# Patient Record
Sex: Female | Born: 1935 | Race: White | Hispanic: No | Smoking: Never smoker
Health system: Southern US, Community
[De-identification: ages and names within clinical notes are randomized; demographics above are authoritative.]

## PROBLEM LIST (undated history)

## (undated) DIAGNOSIS — K259 Gastric ulcer, unspecified as acute or chronic, without hemorrhage or perforation: Secondary | ICD-10-CM

## (undated) DIAGNOSIS — K219 Gastro-esophageal reflux disease without esophagitis: Secondary | ICD-10-CM

## (undated) DIAGNOSIS — M1990 Unspecified osteoarthritis, unspecified site: Secondary | ICD-10-CM

## (undated) DIAGNOSIS — F419 Anxiety disorder, unspecified: Secondary | ICD-10-CM

## (undated) DIAGNOSIS — E785 Hyperlipidemia, unspecified: Secondary | ICD-10-CM

## (undated) DIAGNOSIS — I4891 Unspecified atrial fibrillation: Secondary | ICD-10-CM

## (undated) DIAGNOSIS — I10 Essential (primary) hypertension: Secondary | ICD-10-CM

## (undated) HISTORY — PX: CHOLECYSTECTOMY: SHX55

## (undated) HISTORY — PX: ABDOMINAL HYSTERECTOMY: SHX81

## (undated) HISTORY — PX: OTHER SURGICAL HISTORY: SHX169

---

## 2014-05-05 DIAGNOSIS — Z9181 History of falling: Secondary | ICD-10-CM | POA: Diagnosis not present

## 2014-05-05 DIAGNOSIS — Z79899 Other long term (current) drug therapy: Secondary | ICD-10-CM | POA: Diagnosis not present

## 2014-05-05 DIAGNOSIS — E782 Mixed hyperlipidemia: Secondary | ICD-10-CM | POA: Diagnosis not present

## 2014-05-05 DIAGNOSIS — Z0000 Encounter for general adult medical examination without abnormal findings: Secondary | ICD-10-CM | POA: Diagnosis not present

## 2014-12-18 DIAGNOSIS — E669 Obesity, unspecified: Secondary | ICD-10-CM | POA: Diagnosis not present

## 2014-12-18 DIAGNOSIS — M810 Age-related osteoporosis without current pathological fracture: Secondary | ICD-10-CM | POA: Diagnosis not present

## 2014-12-18 DIAGNOSIS — E782 Mixed hyperlipidemia: Secondary | ICD-10-CM | POA: Diagnosis not present

## 2014-12-18 DIAGNOSIS — I10 Essential (primary) hypertension: Secondary | ICD-10-CM | POA: Diagnosis not present

## 2015-12-13 DIAGNOSIS — N3091 Cystitis, unspecified with hematuria: Secondary | ICD-10-CM | POA: Diagnosis not present

## 2015-12-13 DIAGNOSIS — R300 Dysuria: Secondary | ICD-10-CM | POA: Diagnosis not present

## 2016-02-25 DIAGNOSIS — L570 Actinic keratosis: Secondary | ICD-10-CM | POA: Diagnosis not present

## 2016-02-25 DIAGNOSIS — L821 Other seborrheic keratosis: Secondary | ICD-10-CM | POA: Diagnosis not present

## 2016-02-25 DIAGNOSIS — C44319 Basal cell carcinoma of skin of other parts of face: Secondary | ICD-10-CM | POA: Diagnosis not present

## 2016-06-09 DIAGNOSIS — M353 Polymyalgia rheumatica: Secondary | ICD-10-CM | POA: Diagnosis not present

## 2016-06-09 DIAGNOSIS — Z0000 Encounter for general adult medical examination without abnormal findings: Secondary | ICD-10-CM | POA: Diagnosis not present

## 2016-06-09 DIAGNOSIS — R739 Hyperglycemia, unspecified: Secondary | ICD-10-CM | POA: Diagnosis not present

## 2016-06-09 DIAGNOSIS — Z79899 Other long term (current) drug therapy: Secondary | ICD-10-CM | POA: Diagnosis not present

## 2016-06-09 DIAGNOSIS — E785 Hyperlipidemia, unspecified: Secondary | ICD-10-CM | POA: Diagnosis not present

## 2016-06-09 DIAGNOSIS — I10 Essential (primary) hypertension: Secondary | ICD-10-CM | POA: Diagnosis not present

## 2016-06-22 DIAGNOSIS — M25549 Pain in joints of unspecified hand: Secondary | ICD-10-CM | POA: Diagnosis not present

## 2016-06-22 DIAGNOSIS — M6530 Trigger finger, unspecified finger: Secondary | ICD-10-CM | POA: Diagnosis not present

## 2016-08-14 ENCOUNTER — Other Ambulatory Visit: Payer: Self-pay

## 2016-08-14 NOTE — Patient Outreach (Signed)
[facility]) Care Management  08/14/2016  TIJANA WALDER 25-Sep-1931 [id number]  Medication Adherence call to Mrs. Monica French the reason for this call is because Mrs. Blew is showing past due under [place] on her pravastatin 20 mg  spoke to patient and said doctor [employer] told her  to take it every other day instead of one tablet a day Mrs. Wilmer said she does not want to receive any more calls from [place] care to take her off the list.    Progress Management Direct Dial [phone]  Fax [phone] [email or web address]

## 2017-03-11 DIAGNOSIS — J0100 Acute maxillary sinusitis, unspecified: Secondary | ICD-10-CM | POA: Diagnosis not present

## 2017-06-12 DIAGNOSIS — M353 Polymyalgia rheumatica: Secondary | ICD-10-CM | POA: Diagnosis not present

## 2017-06-12 DIAGNOSIS — E785 Hyperlipidemia, unspecified: Secondary | ICD-10-CM | POA: Diagnosis not present

## 2017-06-12 DIAGNOSIS — I10 Essential (primary) hypertension: Secondary | ICD-10-CM | POA: Diagnosis not present

## 2017-06-12 DIAGNOSIS — Z79899 Other long term (current) drug therapy: Secondary | ICD-10-CM | POA: Diagnosis not present

## 2017-06-12 DIAGNOSIS — Z0000 Encounter for general adult medical examination without abnormal findings: Secondary | ICD-10-CM | POA: Diagnosis not present

## 2017-11-27 DIAGNOSIS — Z23 Encounter for immunization: Secondary | ICD-10-CM | POA: Diagnosis not present

## 2018-06-13 DIAGNOSIS — Z0000 Encounter for general adult medical examination without abnormal findings: Secondary | ICD-10-CM | POA: Diagnosis not present

## 2018-06-13 DIAGNOSIS — R739 Hyperglycemia, unspecified: Secondary | ICD-10-CM | POA: Diagnosis not present

## 2018-06-13 DIAGNOSIS — Z79899 Other long term (current) drug therapy: Secondary | ICD-10-CM | POA: Diagnosis not present

## 2018-06-13 DIAGNOSIS — M8580 Other specified disorders of bone density and structure, unspecified site: Secondary | ICD-10-CM | POA: Diagnosis not present

## 2018-06-13 DIAGNOSIS — M353 Polymyalgia rheumatica: Secondary | ICD-10-CM | POA: Diagnosis not present

## 2018-06-13 DIAGNOSIS — E785 Hyperlipidemia, unspecified: Secondary | ICD-10-CM | POA: Diagnosis not present

## 2018-06-13 DIAGNOSIS — I10 Essential (primary) hypertension: Secondary | ICD-10-CM | POA: Diagnosis not present

## 2018-06-20 DIAGNOSIS — L209 Atopic dermatitis, unspecified: Secondary | ICD-10-CM | POA: Diagnosis not present

## 2019-06-06 DIAGNOSIS — Z0000 Encounter for general adult medical examination without abnormal findings: Secondary | ICD-10-CM | POA: Diagnosis not present

## 2019-06-06 DIAGNOSIS — E785 Hyperlipidemia, unspecified: Secondary | ICD-10-CM | POA: Diagnosis not present

## 2019-06-06 DIAGNOSIS — R739 Hyperglycemia, unspecified: Secondary | ICD-10-CM | POA: Diagnosis not present

## 2019-06-06 DIAGNOSIS — Z79899 Other long term (current) drug therapy: Secondary | ICD-10-CM | POA: Diagnosis not present

## 2019-06-06 DIAGNOSIS — T466X5A Adverse effect of antihyperlipidemic and antiarteriosclerotic drugs, initial encounter: Secondary | ICD-10-CM | POA: Diagnosis not present

## 2019-06-06 DIAGNOSIS — I10 Essential (primary) hypertension: Secondary | ICD-10-CM | POA: Diagnosis not present

## 2019-06-06 DIAGNOSIS — M791 Myalgia, unspecified site: Secondary | ICD-10-CM | POA: Diagnosis not present

## 2019-07-08 DIAGNOSIS — M7050 Other bursitis of knee, unspecified knee: Secondary | ICD-10-CM | POA: Diagnosis not present

## 2019-08-18 DIAGNOSIS — M25562 Pain in left knee: Secondary | ICD-10-CM | POA: Diagnosis not present

## 2019-08-18 DIAGNOSIS — M1712 Unilateral primary osteoarthritis, left knee: Secondary | ICD-10-CM | POA: Diagnosis not present

## 2020-06-11 DIAGNOSIS — Z79899 Other long term (current) drug therapy: Secondary | ICD-10-CM | POA: Diagnosis not present

## 2020-06-11 DIAGNOSIS — Z0000 Encounter for general adult medical examination without abnormal findings: Secondary | ICD-10-CM | POA: Diagnosis not present

## 2020-06-11 DIAGNOSIS — M353 Polymyalgia rheumatica: Secondary | ICD-10-CM | POA: Diagnosis not present

## 2020-06-11 DIAGNOSIS — I10 Essential (primary) hypertension: Secondary | ICD-10-CM | POA: Diagnosis not present

## 2020-06-11 DIAGNOSIS — G720 Drug-induced myopathy: Secondary | ICD-10-CM | POA: Diagnosis not present

## 2020-06-11 DIAGNOSIS — E785 Hyperlipidemia, unspecified: Secondary | ICD-10-CM | POA: Diagnosis not present

## 2020-06-11 DIAGNOSIS — M8580 Other specified disorders of bone density and structure, unspecified site: Secondary | ICD-10-CM | POA: Diagnosis not present

## 2020-06-11 DIAGNOSIS — D692 Other nonthrombocytopenic purpura: Secondary | ICD-10-CM | POA: Diagnosis not present

## 2020-06-11 DIAGNOSIS — M1991 Primary osteoarthritis, unspecified site: Secondary | ICD-10-CM | POA: Diagnosis not present

## 2020-06-11 DIAGNOSIS — R7301 Impaired fasting glucose: Secondary | ICD-10-CM | POA: Diagnosis not present

## 2020-07-13 DIAGNOSIS — M170 Bilateral primary osteoarthritis of knee: Secondary | ICD-10-CM | POA: Diagnosis not present

## 2020-07-13 DIAGNOSIS — G8929 Other chronic pain: Secondary | ICD-10-CM | POA: Insufficient documentation

## 2020-07-13 DIAGNOSIS — M1712 Unilateral primary osteoarthritis, left knee: Secondary | ICD-10-CM | POA: Diagnosis not present

## 2020-07-13 DIAGNOSIS — M25462 Effusion, left knee: Secondary | ICD-10-CM | POA: Diagnosis not present

## 2020-07-13 DIAGNOSIS — M25461 Effusion, right knee: Secondary | ICD-10-CM | POA: Diagnosis not present

## 2020-07-13 DIAGNOSIS — M25561 Pain in right knee: Secondary | ICD-10-CM | POA: Diagnosis not present

## 2020-07-13 DIAGNOSIS — M1711 Unilateral primary osteoarthritis, right knee: Secondary | ICD-10-CM | POA: Diagnosis not present

## 2020-07-13 DIAGNOSIS — M25562 Pain in left knee: Secondary | ICD-10-CM | POA: Diagnosis not present

## 2020-07-13 HISTORY — DX: Other chronic pain: G89.29

## 2020-08-23 ENCOUNTER — Other Ambulatory Visit: Payer: Self-pay | Admitting: Orthopedic Surgery

## 2020-09-07 ENCOUNTER — Encounter (HOSPITAL_COMMUNITY): Payer: Self-pay

## 2020-09-07 NOTE — Patient Instructions (Addendum)
DUE TO COVID-19 ONLY ONE VISITOR IS ALLOWED TO COME WITH YOU AND STAY IN THE WAITING ROOM ONLY DURING PRE OP AND PROCEDURE.   **NO VISITORS ARE ALLOWED IN THE SHORT STAY AREA OR RECOVERY ROOM!!**  IF YOU WILL BE ADMITTED INTO THE HOSPITAL YOU ARE ALLOWED ONLY TWO SUPPORT PEOPLE DURING VISITATION HOURS ONLY (10AM -8PM)   The support person(s) may change daily. The support person(s) must pass our screening, gel in and out, and wear a mask at all times, including in the patient's room. Patients must also wear a mask when staff or their support person are in the room.  No visitors under the age of 71. Any visitor under the age of 69 must be accompanied by an adult.   COVID SWAB TESTING MUST BE COMPLETED ON:  Thursday, 09-16-20 Between the hours of 8 and 3  **MUST PRESENT COMPLETED FORM AT TESTING SITE**    [place] (backside of the building)  You are not required to quarantine, however you are required to wear a well-fitted mask when you are out and around people not in your household.  Hand Hygiene often Do NOT share personal items Notify your provider if you are in close contact with someone who has COVID or you develop fever 100.4 or greater, new onset of sneezing, cough, sore throat, shortness of breath or body aches.         Your procedure is scheduled on:  Monday, 09-20-20   Report to [facility] Main  Entrance   Report to Short Stay at 5:15 AM   [facility])    Call this number if you have problems the morning of surgery [phone]   Do not eat food :After Midnight.   May have liquids until 5:00 AM day of surgery  CLEAR LIQUID DIET  Foods Allowed                                                                     Foods Excluded  Water, Black Coffee (no milk/no creamer) and tea, regular and decaf                              liquids that you cannot  Plain Jell-O in any flavor  (No red)                         see through such as: Fruit  ices (not with fruit pulp)                                 milk, soups, orange juice  Iced Popsicles (No red)                                    All solid food                             Apple juices Sports drinks like Gatorade (No red) Lightly seasoned clear broth or consume(fat free) Sugar  Complete one Ensure drink the morning of surgery at 5:00 AM  the day of surgery.        The day of surgery:  Drink ONE (1) Pre-Surgery Clear Ensure the morning of surgery. Drink in one sitting. Do not sip.  This drink was given to you during your hospital  pre-op appointment visit. Nothing else to drink after completing the Pre-Surgery Clear Ensure          If you have questions, please contact your surgeon's office.     Oral Hygiene is also important to reduce your risk of infection.                                    Remember - BRUSH YOUR TEETH THE MORNING OF SURGERY WITH YOUR REGULAR TOOTHPASTE   Do NOT smoke after Midnight   Take these medicines the morning of surgery with A SIP OF WATER:  Alprazolam, Amlodipine              You may not have any metal on your body including hair pins, jewelry, and body piercing             Do not wear make-up, lotions, powders, perfumes or deodorant  Do not wear nail polish including gel and S&S, artificial/acrylic nails, or any other type of covering on natural nails including finger and toenails. If you have artificial nails, gel coating, etc. that needs to be removed by a nail salon please have this removed prior to surgery or surgery may need to be canceled/ delayed if the surgeon/ anesthesia feels like they are unable to be safely monitored.   Do not shave  48 hours prior to surgery.         Do not bring valuables to the hospital. [place].   Contacts, dentures or bridgework may not be worn into surgery.   Bring small overnight bag day of surgery.   Special Instructions: Bring a copy of your healthcare  power of attorney and living will documents the day of surgery if you haven't scanned them in before.  Please read over the following fact sheets you were given: IF YOU HAVE QUESTIONS ABOUT YOUR PRE OP INSTRUCTIONS PLEASE CALL [place] - Preparing for Surgery Before surgery, you can play an important role.  Because skin is not sterile, your skin needs to be as free of germs as possible.  You can reduce the number of germs on your skin by washing with CHG (chlorahexidine gluconate) soap before surgery.  CHG is an antiseptic cleaner which kills germs and bonds with the skin to continue killing germs even after washing. Please DO NOT use if you have an allergy to CHG or antibacterial soaps.  If your skin becomes reddened/irritated stop using the CHG and inform your nurse when you arrive at Short Stay. Do not shave (including legs and underarms) for at least 48 hours prior to the first CHG shower.  You may shave your face/neck.  Please follow these instructions carefully:  1.  Shower with CHG Soap the night before surgery and the  morning of surgery.  2.  If you choose to wash your hair, wash your hair first as usual with your normal  shampoo.  3.  After you shampoo, rinse your hair and body thoroughly to remove the shampoo.  4.  Use CHG as you would any other liquid soap.  You can apply chg directly to the skin and wash.  Gently with a scrungie or clean washcloth.  5.  Apply the CHG Soap to your body ONLY FROM THE NECK DOWN.   Do   not use on face/ open                           Wound or open sores. Avoid contact with eyes, ears mouth and   genitals (private parts).                       Wash face,  Genitals (private parts) with your normal soap.             6.  Wash thoroughly, paying special attention to the area where your    surgery  will be performed.  7.  Thoroughly rinse your body with warm water from the neck down.  8.  DO NOT shower/wash with  your normal soap after using and rinsing off the CHG Soap.                9.  Pat yourself dry with a clean towel.            10.  Wear clean pajamas.            11.  Place clean sheets on your bed the night of your first shower and do not  sleep with pets. Day of Surgery : Do not apply any lotions/deodorants the morning of surgery.  Please wear clean clothes to the hospital/surgery center.  FAILURE TO FOLLOW THESE INSTRUCTIONS MAY RESULT IN THE CANCELLATION OF YOUR SURGERY  PATIENT SIGNATURE_________________________________  NURSE SIGNATURE__________________________________  ________________________________________________________________________   Monica French  An incentive spirometer is a tool that can help keep your lungs clear and active. This tool measures how well you are filling your lungs with each breath. Taking long deep breaths may help reverse or decrease the chance of developing breathing (pulmonary) problems (especially infection) following: A long period of time when you are unable to move or be active. BEFORE THE PROCEDURE  If the spirometer includes an indicator to show your best effort, your nurse or respiratory therapist will set it to a desired goal. If possible, sit up straight or lean slightly forward. Try not to slouch. Hold the incentive spirometer in an upright position. INSTRUCTIONS FOR USE  Sit on the edge of your bed if possible, or sit up as far as you can in bed or on a chair. Hold the incentive spirometer in an upright position. Breathe out normally. Place the mouthpiece in your mouth and seal your lips tightly around it. Breathe in slowly and as deeply as possible, raising the piston or the ball toward the top of the column. Hold your breath for 3-5 seconds or for as long as possible. Allow the piston or ball to fall to the bottom of the column. Remove the mouthpiece from your mouth and breathe out normally. Rest for a few seconds and repeat  Steps 1 through 7 at least 10 times every 1-2 hours when you are awake. Take your time and take a few normal breaths between deep breaths. The spirometer may include an indicator to show your best effort. Use the indicator as a goal to work toward during each repetition. After each set of 10 deep breaths, practice coughing to be sure  your lungs are clear. If you have an incision (the cut made at the time of surgery), support your incision when coughing by placing a pillow or rolled up towels firmly against it. Once you are able to get out of bed, walk around indoors and cough well. You may stop using the incentive spirometer when instructed by your caregiver.  RISKS AND COMPLICATIONS Take your time so you do not get dizzy or light-headed. If you are in pain, you may need to take or ask for pain medication before doing incentive spirometry. It is harder to take a deep breath if you are having pain. AFTER USE Rest and breathe slowly and easily. It can be helpful to keep track of a log of your progress. Your caregiver can provide you with a simple table to help with this. If you are using the spirometer at home, follow these instructions: [place] IF:  You are having difficultly using the spirometer. You have trouble using the spirometer as often as instructed. Your pain medication is not giving enough relief while using the spirometer. You develop fever of 100.5 F (38.1 C) or higher. SEEK IMMEDIATE MEDICAL CARE IF:  You cough up bloody sputum that had not been present before. You develop fever of 102 F (38.9 C) or greater. You develop worsening pain at or near the incision site. MAKE SURE YOU:  Understand these instructions. Will watch your condition. Will get help right away if you are not doing well or get worse. Document Released: 05/01/2006 Document Revised: 03/13/2011 Document Reviewed: 07/02/2006 [facility] Patient Information 2014 [place],  [place].   ________________________________________________________________________

## 2020-09-07 NOTE — Progress Notes (Addendum)
COVID swab appointment: 09-16-20  COVID Vaccine Completed:  Yes x3 Date COVID Vaccine completed: Has received booster:  yes x1 COVID vaccine manufacturer:     Moderna     Date of COVID positive in last 90 days:  No  PCP - Williams Che, MD Cardiologist - N/A  Chest x-ray - N/A EKG - 09-09-20 Epic Stress Test - N/A ECHO - N/a Cardiac Cath - N/A Pacemaker/ICD device last checked: Spinal Cord Stimulator:  Sleep Study - N/A CPAP -   Fasting Blood Sugar - N/A Checks Blood Sugar _____ times a day  Blood Thinner Instructions: N/A Aspirin Instructions: Last Dose:  Activity level:  Can go up a flight of stairs and perform activities of daily living without stopping and without symptoms of chest pain or shortness of breath. Able to exercise without symptoms   Anesthesia review:  Afib noted on EKG today.  Patient seen by cardiology today  Patient denies shortness of breath, fever, cough and chest pain at PAT appointment   Patient verbalized understanding of instructions that were given to them at the PAT appointment. Patient was also instructed that they will need to review over the PAT instructions again at home before surgery.

## 2020-09-09 ENCOUNTER — Encounter (HOSPITAL_COMMUNITY)
Admission: RE | Admit: 2020-09-09 | Discharge: 2020-09-09 | Disposition: A | Discharge status: Home / Self Care | Payer: Medicare Other | Source: Ambulatory Visit | Attending: Orthopedic Surgery | Admitting: Orthopedic Surgery

## 2020-09-09 ENCOUNTER — Encounter (HOSPITAL_COMMUNITY): Payer: Self-pay | Admitting: Physician Assistant

## 2020-09-09 ENCOUNTER — Encounter (HOSPITAL_COMMUNITY): Payer: Self-pay

## 2020-09-09 ENCOUNTER — Ambulatory Visit (HOSPITAL_COMMUNITY)
Admission: RE | Admit: 2020-09-09 | Discharge: 2020-09-09 | Disposition: A | Discharge status: Home / Self Care | Payer: Medicare Other | Source: Ambulatory Visit | Attending: Physician Assistant | Admitting: Physician Assistant

## 2020-09-09 ENCOUNTER — Other Ambulatory Visit: Payer: Self-pay

## 2020-09-09 VITALS — BP 138/84 | HR 142 | Ht 63.0 in | Wt 150.2 lb

## 2020-09-09 DIAGNOSIS — Z01818 Encounter for other preprocedural examination: Secondary | ICD-10-CM | POA: Diagnosis not present

## 2020-09-09 DIAGNOSIS — I480 Paroxysmal atrial fibrillation: Secondary | ICD-10-CM | POA: Insufficient documentation

## 2020-09-09 DIAGNOSIS — E785 Hyperlipidemia, unspecified: Secondary | ICD-10-CM | POA: Diagnosis not present

## 2020-09-09 DIAGNOSIS — I10 Essential (primary) hypertension: Secondary | ICD-10-CM | POA: Insufficient documentation

## 2020-09-09 DIAGNOSIS — I4891 Unspecified atrial fibrillation: Secondary | ICD-10-CM | POA: Insufficient documentation

## 2020-09-09 DIAGNOSIS — Z79899 Other long term (current) drug therapy: Secondary | ICD-10-CM | POA: Insufficient documentation

## 2020-09-09 DIAGNOSIS — Z7901 Long term (current) use of anticoagulants: Secondary | ICD-10-CM | POA: Insufficient documentation

## 2020-09-09 DIAGNOSIS — D6869 Other thrombophilia: Secondary | ICD-10-CM | POA: Insufficient documentation

## 2020-09-09 HISTORY — DX: Anxiety disorder, unspecified: F41.9

## 2020-09-09 HISTORY — DX: Other thrombophilia: D68.69

## 2020-09-09 HISTORY — DX: Unspecified osteoarthritis, unspecified site: M19.90

## 2020-09-09 HISTORY — DX: Paroxysmal atrial fibrillation: I48.0

## 2020-09-09 HISTORY — DX: Hyperlipidemia, unspecified: E78.5

## 2020-09-09 HISTORY — DX: Essential (primary) hypertension: I10

## 2020-09-09 HISTORY — DX: Gastric ulcer, unspecified as acute or chronic, without hemorrhage or perforation: K25.9

## 2020-09-09 LAB — CBC WITH DIFFERENTIAL/PLATELET
Abs Immature Granulocytes: 0.02 10*3/uL (ref 0.00–0.07)
Basophils Absolute: 0 10*3/uL (ref 0.0–0.1)
Basophils Relative: 1 %
Eosinophils Absolute: 0.1 10*3/uL (ref 0.0–0.5)
Eosinophils Relative: 1 %
HCT: 43.5 % (ref 36.0–46.0)
Hemoglobin: 13.7 g/dL (ref 12.0–15.0)
Immature Granulocytes: 0 %
Lymphocytes Relative: 35 %
Lymphs Abs: 2.1 10*3/uL (ref 0.7–4.0)
MCH: 28.9 pg (ref 26.0–34.0)
MCHC: 31.5 g/dL (ref 30.0–36.0)
MCV: 91.8 fL (ref 80.0–100.0)
Monocytes Absolute: 0.4 10*3/uL (ref 0.1–1.0)
Monocytes Relative: 7 %
Neutro Abs: 3.4 10*3/uL (ref 1.7–7.7)
Neutrophils Relative %: 56 %
Platelets: 204 10*3/uL (ref 150–400)
RBC: 4.74 MIL/uL (ref 3.87–5.11)
RDW: 14.4 % (ref 11.5–15.5)
WBC: 6 10*3/uL (ref 4.0–10.5)
nRBC: 0 % (ref 0.0–0.2)

## 2020-09-09 LAB — COMPREHENSIVE METABOLIC PANEL
ALT: 13 U/L (ref 0–44)
AST: 16 U/L (ref 15–41)
Albumin: 4.4 g/dL (ref 3.5–5.0)
Alkaline Phosphatase: 59 U/L (ref 38–126)
Anion gap: 10 (ref 5–15)
BUN: 22 mg/dL (ref 8–23)
CO2: 26 mmol/L (ref 22–32)
Calcium: 10 mg/dL (ref 8.9–10.3)
Chloride: 104 mmol/L (ref 98–111)
Creatinine, Ser: 0.78 mg/dL (ref 0.44–1.00)
GFR, Estimated: >60 mL/min (ref >60)
Glucose, Bld: 118 mg/dL — ABNORMAL HIGH (ref 70–99)
Potassium: 4.3 mmol/L (ref 3.5–5.1)
Sodium: 140 mmol/L (ref 135–145)
Total Bilirubin: 0.8 mg/dL (ref 0.3–1.2)
Total Protein: 7.2 g/dL (ref 6.5–8.1)

## 2020-09-09 LAB — SURGICAL PCR SCREEN
MRSA, PCR: NEGATIVE
Staphylococcus aureus: NEGATIVE

## 2020-09-09 MED ORDER — METOPROLOL TARTRATE 25 MG PO TABS: 25.0000 mg | ORAL_TABLET | Freq: Two times a day (BID) | ORAL | 3 refills | Status: DC

## 2020-09-09 MED ORDER — APIXABAN 5 MG PO TABS: 5.0000 mg | ORAL_TABLET | Freq: Two times a day (BID) | ORAL | 3 refills | Status: DC

## 2020-09-09 NOTE — Progress Notes (Signed)
Primary Care Physician: Street, Sharon Mt, MD Primary Cardiologist: none Primary Electrophysiologist: none Referring Physician: Elvina Sidle pre admit testing   Monica French is a 87 y.o. female with a history of HLD, HTN, atrial fibrillation who presents for consultation in the [facility]. The patient was initially diagnosed with atrial fibrillation 09/09/20 after presenting for knee surgery. She was incidentally noted to have tachycardia and ECG showed afib with RVR. Patient is asymptomatic. There were no specific triggers that she could identify. Patient has a CHADS2VASC score of 4.   Today, she denies symptoms of palpitations, chest pain, shortness of breath, orthopnea, PND, lower extremity edema, dizziness, presyncope, syncope, snoring, daytime somnolence, bleeding, or neurologic sequela. The patient is tolerating medications without difficulties and is otherwise without complaint today.    Atrial Fibrillation Risk Factors:  she does not have symptoms or diagnosis of sleep apnea. she does not have a history of rheumatic fever. she does not have a history of alcohol use. The patient does not have a history of early familial atrial fibrillation or other arrhythmias.  she has a BMI of Body mass index is 26.61 kg/m.Marland Kitchen Filed Weights   09/09/20 0938  Weight: 68.1 kg    No family history on file.   Atrial Fibrillation Management history:  Previous antiarrhythmic drugs: none Previous cardioversions: none Previous ablations: none CHADS2VASC score: 4 Anticoagulation history: none   Past Medical History:  Diagnosis Date   Anxiety    Arthritis    Gastric ulcer    Hyperlipidemia    Hypertension    Past Surgical History:  Procedure Laterality Date   ABDOMINAL HYSTERECTOMY     CHOLECYSTECTOMY     GYN surgery      Current Outpatient Medications  Medication Sig Dispense Refill   ALPRAZolam (XANAX) 0.25 MG tablet Take 0.125-0.25 mg by  mouth 2 (two) times daily as needed for anxiety.     amLODipine (NORVASC) 10 MG tablet Take 10 mg by mouth every morning.     apixaban (ELIQUIS) 5 MG TABS tablet Take 1 tablet (5 mg total) by mouth 2 (two) times daily. 60 tablet 3   Ascorbic Acid (VITAMIN C) 1000 MG tablet Take 1,000 mg by mouth daily.     benazepril (LOTENSIN) 40 MG tablet Take 40 mg by mouth every morning.     cholecalciferol (VITAMIN D3) 25 MCG (1000 UNIT) tablet Take 1,000 Units by mouth daily.     metoprolol tartrate (LOPRESSOR) 25 MG tablet Take 1 tablet (25 mg total) by mouth 2 (two) times daily. 60 tablet 3   Multiple Vitamins-Minerals (CENTRUM SILVER 50+WOMEN) TABS Take by mouth.     Turmeric 500 MG TABS Take 500 mg by mouth daily.     No current facility-administered medications for this encounter.    Allergies  Allergen Reactions   Codeine Nausea And Vomiting    Social History   Socioeconomic History   Marital status: Widowed    Spouse name: Not on file   Number of children: Not on file   Years of education: Not on file   Highest education level: Not on file  Occupational History   Not on file  Tobacco Use   Smoking status: Never   Smokeless tobacco: Never  Vaping Use   Vaping Use: Never used  Substance and Sexual Activity   Alcohol use: Never   Drug use: Never   Sexual activity: Not on file  Other Topics Concern   Not on file  Social History Narrative   Not on file   Social Determinants of Health   Financial Resource Strain: Not on file  Food Insecurity: Not on file  Transportation Needs: Not on file  Physical Activity: Not on file  Stress: Not on file  Social Connections: Not on file  Intimate Partner Violence: Not on file     ROS- All systems are reviewed and negative except as per the HPI above.  Physical Exam: Vitals:   09/09/20 0938  BP: 138/84  Pulse: (!) 142  Weight: 68.1 kg  Height: '5\' 3"'$  (1.6 m)    GEN- The patient is a well appearing elderly female, alert and  oriented x 3 today.   Head- normocephalic, atraumatic Eyes-  Sclera clear, conjunctiva pink Ears- hearing intact Oropharynx- clear Neck- supple  Lungs- Clear to ausculation bilaterally, normal work of breathing Heart- irregular rate and rhythm, tachycardia, no murmurs, rubs or gallops  GI- soft, NT, ND, + BS Extremities- no clubbing, cyanosis, or edema MS- no significant deformity or atrophy Skin- no rash or lesion Psych- euthymic mood, full affect Neuro- strength and sensation are intact  Wt Readings from Last 3 Encounters:  09/09/20 68.1 kg  09/09/20 67.6 kg    EKG today demonstrates  Afib RVR Vent. rate 142 BPM PR interval * ms QRS duration 84 ms QT/QTcB 316/486 ms   Epic records are reviewed at length today  CHA2DS2-VASc Score = 4  The patient's score is based upon: CHF History: 0 HTN History: 1 Diabetes History: 0 Stroke History: 0 Vascular Disease History: 0 Age Score: 2 Gender Score: 1      ASSESSMENT AND PLAN: 1. New onset atrial fibrillation  The patient's CHA2DS2-VASc score is 4, indicating a 4.8% annual risk of stroke.   Patient in rapid asymptomatic afib. General education about afib provided and questions answered. We also discussed her stroke risk and the risks and benefits of anticoagulation. Will start Eliquis 5 mg BID (weight >60 kg, Cr < 1.5) Start Lopressor 25 mg BID  Would check echo once she is better rate controlled.  Could consider DCCV after 3 weeks of anticoagulation. Or, given her advanced age and paucity of symptoms, could consider a rate control strategy.   2. Secondary Hypercoagulable State (ICD10:  D68.69) The patient is at significant risk for stroke/thromboembolism based upon her CHA2DS2-VASc Score of 4.  Start Apixaban (Eliquis).   3. HTN Stable, med changes as above.   Follow up in the AF clinic next week to reevaluate rate control. Patient would like long-term follow up in the [place] office, will refer.    [facility] [address] [place], [place] [address] [phone] 09/09/2020 10:20 AM

## 2020-09-09 NOTE — Patient Instructions (Signed)
Start Eliquis 5 mg twice a day  Start metoprolol 25 mg twice a day   

## 2020-09-10 NOTE — Progress Notes (Signed)
Anesthesia Chart Review   Case: [id number] Date/Time: 09/20/20 0745   Procedure: TOTAL KNEE ARTHROPLASTY (Right: Knee)   Anesthesia type: Spinal   Pre-op diagnosis: Osteoarthritis of right knee M17.11   Location: [facility] ROOM 06 / [facility]   Surgeons: Vickey Huger, MD       DISCUSSION:87 y.o. never smoker with h/o HTN, right knee OA scheduled for above procedure 09/20/2020 with Dr. Vickey Huger.   Pt found to be in a-fib with RVR, rate of 111bpm.  No history of a-fib.  I called the a-fib clinic who can see here today. Pt stable for discharge with f/u this morning with a-fib clinic.   Addendum 09/10/2020:  Pt started on Eliquis at this visit and Lopressor. She has a follow up visit next week for recheck on 09/13/2020.  Echo to be ordered once she is rate controlled. Discussed with Dr. Ruel Favors office. Will continue to follow.  VS: BP (!) 152/89   Pulse 66   Temp 36.8 C   Resp 18   Ht '5\' 3"'$  (1.6 m)   Wt 67.6 kg   SpO2 100%   BMI 26.39 kg/m   PROVIDERS: Street, Sharon Mt, MD is PCP   Shirlee More, MD is Cardiologist  LABS: Labs reviewed: Acceptable for surgery. (all labs ordered are listed, but only abnormal results are displayed)  Labs Reviewed  COMPREHENSIVE METABOLIC PANEL - Abnormal; Notable for the following components:      Result Value   Glucose, Bld 118 (*)    All other components within normal limits  SURGICAL PCR SCREEN  CBC WITH DIFFERENTIAL/PLATELET     IMAGES:   EKG: 09/09/2020 Rate 111 bpm Atrial fibrillation with rapid ventricular response Low voltage QRS Cannot rule out Anterior infarct , age undetermined IWMI No old tracing to compare  CV:  Past Medical History:  Diagnosis Date   Anxiety    Arthritis    Gastric ulcer    Hyperlipidemia    Hypertension     Past Surgical History:  Procedure Laterality Date   ABDOMINAL HYSTERECTOMY     CHOLECYSTECTOMY     GYN surgery      MEDICATIONS:  ALPRAZolam (XANAX) 0.25 MG tablet   amLODipine (NORVASC)  10 MG tablet   apixaban (ELIQUIS) 5 MG TABS tablet   Ascorbic Acid (VITAMIN C) 1000 MG tablet   benazepril (LOTENSIN) 40 MG tablet   cholecalciferol (VITAMIN D3) 25 MCG (1000 UNIT) tablet   metoprolol tartrate (LOPRESSOR) 25 MG tablet   Multiple Vitamins-Minerals (CENTRUM SILVER 50+WOMEN) TABS   Turmeric 500 MG TABS   No current facility-administered medications for this encounter.     Konrad Felix Ward, PA-C [facility]-Surgical Testing [phone]

## 2020-09-13 ENCOUNTER — Ambulatory Visit (HOSPITAL_COMMUNITY)
Admission: RE | Admit: 2020-09-13 | Discharge: 2020-09-13 | Disposition: A | Discharge status: Home / Self Care | Payer: Medicare Other | Source: Ambulatory Visit | Attending: Physician Assistant | Admitting: Physician Assistant

## 2020-09-13 ENCOUNTER — Encounter (HOSPITAL_COMMUNITY): Payer: Self-pay | Admitting: Physician Assistant

## 2020-09-13 ENCOUNTER — Other Ambulatory Visit: Payer: Self-pay

## 2020-09-13 VITALS — BP 142/82 | HR 106 | Ht 63.0 in | Wt 150.8 lb

## 2020-09-13 DIAGNOSIS — D6869 Other thrombophilia: Secondary | ICD-10-CM | POA: Diagnosis not present

## 2020-09-13 DIAGNOSIS — Z7901 Long term (current) use of anticoagulants: Secondary | ICD-10-CM | POA: Diagnosis not present

## 2020-09-13 DIAGNOSIS — I4819 Other persistent atrial fibrillation: Secondary | ICD-10-CM

## 2020-09-13 DIAGNOSIS — E785 Hyperlipidemia, unspecified: Secondary | ICD-10-CM | POA: Insufficient documentation

## 2020-09-13 DIAGNOSIS — I10 Essential (primary) hypertension: Secondary | ICD-10-CM | POA: Insufficient documentation

## 2020-09-13 DIAGNOSIS — Z79899 Other long term (current) drug therapy: Secondary | ICD-10-CM | POA: Diagnosis not present

## 2020-09-13 DIAGNOSIS — I4821 Permanent atrial fibrillation: Secondary | ICD-10-CM | POA: Insufficient documentation

## 2020-09-13 HISTORY — DX: Other persistent atrial fibrillation: I48.19

## 2020-09-13 MED ORDER — METOPROLOL TARTRATE 50 MG PO TABS: 50.0000 mg | ORAL_TABLET | Freq: Two times a day (BID) | ORAL | 3 refills | Status: DC

## 2020-09-13 NOTE — Patient Instructions (Signed)
Decrease amlodipine to '5mg'$  once a day (1/2 of your '10mg'$  tablet)  Increase metoprolol to '50mg'$  twice a day (2 of your '25mg'$  tablets twice a day)

## 2020-09-13 NOTE — Progress Notes (Addendum)
Primary Care Physician: Street, Sharon Mt, MD Primary Cardiologist: Dr Bettina Gavia (new) Primary Electrophysiologist: none Referring Physician: Elvina Sidle pre admit testing   KEIERA WEISLER is a 87 y.o. female with a history of HLD, HTN, atrial fibrillation who presents for consultation in the [facility]. The patient was initially diagnosed with atrial fibrillation 09/09/20 after presenting for knee surgery. She was incidentally noted to have tachycardia and ECG showed afib with RVR. Patient is asymptomatic. There were no specific triggers that she could identify. Patient has a CHADS2VASC score of 4.   On follow up today, patient remains unaware of her arrhythmia. Her heart rate is better controlled on the BB. She denies any bleeding issues on anticoagulation.   Today, she denies symptoms of palpitations, chest pain, shortness of breath, orthopnea, PND, lower extremity edema, dizziness, presyncope, syncope, snoring, daytime somnolence, bleeding, or neurologic sequela. The patient is tolerating medications without difficulties and is otherwise without complaint today.    Atrial Fibrillation Risk Factors:  she does not have symptoms or diagnosis of sleep apnea. she does not have a history of rheumatic fever. she does not have a history of alcohol use. The patient does not have a history of early familial atrial fibrillation or other arrhythmias.  she has a BMI of Body mass index is 26.71 kg/m.Marland Kitchen Filed Weights   09/13/20 1403  Weight: 68.4 kg    No family history on file.   Atrial Fibrillation Management history:  Previous antiarrhythmic drugs: none Previous cardioversions: none Previous ablations: none CHADS2VASC score: 4 Anticoagulation history: none   Past Medical History:  Diagnosis Date   Anxiety    Arthritis    Gastric ulcer    Hyperlipidemia    Hypertension    Past Surgical History:  Procedure Laterality Date   ABDOMINAL  HYSTERECTOMY     CHOLECYSTECTOMY     GYN surgery      Current Outpatient Medications  Medication Sig Dispense Refill   ALPRAZolam (XANAX) 0.25 MG tablet Take 0.125-0.25 mg by mouth 2 (two) times daily as needed for anxiety.     amLODipine (NORVASC) 10 MG tablet Take 10 mg by mouth every morning.     apixaban (ELIQUIS) 5 MG TABS tablet Take 1 tablet (5 mg total) by mouth 2 (two) times daily. 60 tablet 3   Ascorbic Acid (VITAMIN C) 1000 MG tablet Take 1,000 mg by mouth daily.     benazepril (LOTENSIN) 40 MG tablet Take 40 mg by mouth every morning.     cholecalciferol (VITAMIN D3) 25 MCG (1000 UNIT) tablet Take 1,000 Units by mouth daily.     metoprolol tartrate (LOPRESSOR) 25 MG tablet Take 1 tablet (25 mg total) by mouth 2 (two) times daily. 60 tablet 3   Multiple Vitamins-Minerals (CENTRUM SILVER 50+WOMEN) TABS Take by mouth.     Turmeric 500 MG TABS Take 500 mg by mouth daily.     No current facility-administered medications for this encounter.    Allergies  Allergen Reactions   Codeine Nausea And Vomiting    Social History   Socioeconomic History   Marital status: Widowed    Spouse name: Not on file   Number of children: Not on file   Years of education: Not on file   Highest education level: Not on file  Occupational History   Not on file  Tobacco Use   Smoking status: Never   Smokeless tobacco: Never  Vaping Use   Vaping Use: Never used  Substance and Sexual Activity   Alcohol use: Never   Drug use: Never   Sexual activity: Not on file  Other Topics Concern   Not on file  Social History Narrative   Not on file   Social Determinants of Health   Financial Resource Strain: Not on file  Food Insecurity: Not on file  Transportation Needs: Not on file  Physical Activity: Not on file  Stress: Not on file  Social Connections: Not on file  Intimate Partner Violence: Not on file     ROS- All systems are reviewed and negative except as per the HPI  above.  Physical Exam: Vitals:   09/13/20 1403  BP: (!) 142/82  Pulse: (!) 106  Weight: 68.4 kg  Height: '5\' 3"'$  (1.6 m)    GEN- The patient is a well appearing elderly female, alert and oriented x 3 today.   HEENT-head normocephalic, atraumatic, sclera clear, conjunctiva pink, hearing intact, trachea midline. Lungs- Clear to ausculation bilaterally, normal work of breathing Heart- irregular rate and rhythm, no murmurs, rubs or gallops  GI- soft, NT, ND, + BS Extremities- no clubbing, cyanosis, or edema MS- no significant deformity or atrophy Skin- no rash or lesion Psych- euthymic mood, full affect Neuro- strength and sensation are intact   Wt Readings from Last 3 Encounters:  09/13/20 68.4 kg  09/09/20 68.1 kg  09/09/20 67.6 kg    EKG today demonstrates  Afib  Vent. rate 106 BPM PR interval * ms QRS duration 88 ms QT/QTcB 330/438 ms   Epic records are reviewed at length today  CHA2DS2-VASc Score = 4  The patient's score is based upon: CHF History: 0 HTN History: 1 Diabetes History: 0 Stroke History: 0 Vascular Disease History: 0 Age Score: 2 Gender Score: 1      ASSESSMENT AND PLAN: 1. Persistent atrial fibrillation  The patient's CHA2DS2-VASc score is 4, indicating a 4.8% annual risk of stroke.   Patient remains in asymptomatic afib, borderline heart rates.  Continue Eliquis 5 mg BID (weight >60 kg, Cr < 1.5) Increase Lopressor to 50 mg BID  Check echocardiogram  Could consider DCCV after 3 weeks of anticoagulation. Or, given her advanced age and paucity of symptoms, could consider a rate control strategy.  Given her new onset afib, would recommend postponing knee surgery for now until her cardiac workup can be completed.   2. Secondary Hypercoagulable State (ICD10:  D68.69) The patient is at significant risk for stroke/thromboembolism based upon her CHA2DS2-VASc Score of 4.  Continue Apixaban (Eliquis).   3. HTN Stable, decrease amlodipine to  accommodate more metoprolol for rate control.    Follow up with Dr Bettina Gavia as scheduled.    [facility] [address] [place], [place] [address] [phone] 09/13/2020 2:15 PM

## 2020-09-15 DIAGNOSIS — I119 Hypertensive heart disease without heart failure: Secondary | ICD-10-CM

## 2020-09-15 DIAGNOSIS — M1990 Unspecified osteoarthritis, unspecified site: Secondary | ICD-10-CM | POA: Insufficient documentation

## 2020-09-15 DIAGNOSIS — K259 Gastric ulcer, unspecified as acute or chronic, without hemorrhage or perforation: Secondary | ICD-10-CM | POA: Insufficient documentation

## 2020-09-15 DIAGNOSIS — E785 Hyperlipidemia, unspecified: Secondary | ICD-10-CM | POA: Insufficient documentation

## 2020-09-15 DIAGNOSIS — I10 Essential (primary) hypertension: Secondary | ICD-10-CM | POA: Insufficient documentation

## 2020-09-15 DIAGNOSIS — F419 Anxiety disorder, unspecified: Secondary | ICD-10-CM | POA: Insufficient documentation

## 2020-09-15 HISTORY — DX: Hypertensive heart disease without heart failure: I11.9

## 2020-09-20 ENCOUNTER — Ambulatory Visit (HOSPITAL_COMMUNITY): Admission: RE | Admit: 2020-09-20 | Payer: Medicare Other | Source: Home / Self Care | Admitting: Orthopedic Surgery

## 2020-09-20 ENCOUNTER — Encounter (HOSPITAL_COMMUNITY): Admission: RE | Payer: Self-pay | Source: Home / Self Care

## 2020-09-20 SURGERY — ARTHROPLASTY, KNEE, TOTAL
Anesthesia: Spinal | Site: Knee | Laterality: Right

## 2020-09-24 ENCOUNTER — Ambulatory Visit (INDEPENDENT_AMBULATORY_CARE_PROVIDER_SITE_OTHER): Payer: Medicare Other

## 2020-09-24 ENCOUNTER — Other Ambulatory Visit: Payer: Self-pay

## 2020-09-24 DIAGNOSIS — I4819 Other persistent atrial fibrillation: Secondary | ICD-10-CM

## 2020-09-24 LAB — ECHOCARDIOGRAM COMPLETE
Area-P 1/2: 5.09 cm2
P 1/2 time: 450 msec
S' Lateral: 2.8 cm

## 2020-09-24 NOTE — Progress Notes (Signed)
Complete echocardiogram performed. Definity needed but not used due to poor IV access.  Jimmy Bascom Biel RDCS, RVT

## 2020-10-01 ENCOUNTER — Ambulatory Visit (INDEPENDENT_AMBULATORY_CARE_PROVIDER_SITE_OTHER): Payer: Medicare Other

## 2020-10-01 ENCOUNTER — Ambulatory Visit: Payer: Medicare Other | Admitting: Cardiology

## 2020-10-01 ENCOUNTER — Other Ambulatory Visit: Payer: Self-pay

## 2020-10-01 ENCOUNTER — Encounter: Payer: Self-pay | Admitting: Cardiology

## 2020-10-01 VITALS — BP 140/70 | HR 74 | Ht 63.0 in | Wt 150.0 lb

## 2020-10-01 DIAGNOSIS — I4819 Other persistent atrial fibrillation: Secondary | ICD-10-CM

## 2020-10-01 DIAGNOSIS — K254 Chronic or unspecified gastric ulcer with hemorrhage: Secondary | ICD-10-CM

## 2020-10-01 DIAGNOSIS — I10 Essential (primary) hypertension: Secondary | ICD-10-CM

## 2020-10-01 DIAGNOSIS — D6869 Other thrombophilia: Secondary | ICD-10-CM

## 2020-10-01 MED ORDER — OMEPRAZOLE 20 MG PO CPDR: 20.0000 mg | DELAYED_RELEASE_CAPSULE | Freq: Every day | ORAL | 11 refills | Status: DC

## 2020-10-01 MED ORDER — DILTIAZEM HCL 30 MG PO TABS: 30.0000 mg | ORAL_TABLET | Freq: Three times a day (TID) | ORAL | 3 refills | Status: DC

## 2020-10-01 NOTE — Addendum Note (Signed)
Addended by: Resa Miner I on: 10/01/2020 02:39 PM   Modules accepted: Orders

## 2020-10-01 NOTE — Progress Notes (Signed)
Cardiology Office Note:    Date:  10/01/2020   ID:  Monica French, DOB 14-Aug-1931, MRN [id number]  PCP:  Street, Sharon Mt, MD  Cardiologist:  Shirlee More, MD    Referring MD: Oliver Barre, PA    ASSESSMENT:    1. Persistent atrial fibrillation ([place])   2. Secondary hypercoagulable state ([place])   3. Primary hypertension   4. Chronic gastric ulcer with hemorrhage    PLAN:    In order of problems listed above:  She has age-indeterminate atrial fibrillation I suspect she has been in a long-term and I am unable to understand why now she is symptomatic with good rate control that she did not have prior to the diagnosis and I think that is a great deal of anxiety and I think she is having CNS side effects from the beta-blocker.  Her son participated in the evaluation decision making he agrees we will get a discontinue metoprolol and switch her to short acting Cardizem for rate control add PPI to protect her stomach with anticoagulation and for the time being hold off on discussions of cardioversion that she does not want.  I would wait 1 week and apply event monitor to look at her heart rate and ambulatory state. Continue anticoagulants start PPI coverage Continue current treatment calcium channel blocker and benazepril   Next appointment: 4 weeks   Medication Adjustments/Labs and Tests Ordered: Current medicines are reviewed at length with the patient today.  Concerns regarding medicines are outlined above.  Orders Placed This Encounter  Procedures   LONG TERM MONITOR (3-14 DAYS)    Meds ordered this encounter  Medications   diltiazem (CARDIZEM) 30 MG tablet    Sig: Take 1 tablet (30 mg total) by mouth 3 (three) times daily.    Dispense:  270 tablet    Refill:  3   omeprazole (PRILOSEC) 20 MG capsule    Sig: Take 1 capsule (20 mg total) by mouth daily.    Dispense:  30 capsule    Refill:  11     Chief Complaint  Patient presents with   Atrial Fibrillation    Follow-up    History of Present Illness:    Monica French is a 87 y.o. female with a hx of atrial fibrillation noted 09/09/2020 after presenting for knee surgery.  She subsequent has been seen twice in the atrial fibrillation clinic and is anticoagulated with apixaban and on beta-blocker for heart rate control with consideration of cardioversion.  She was last seen 09/09/2020.  Other problems include hypertension hyperlipidemia history of gastric ulcer.  Compliance with diet, lifestyle and medications: Yes  I know her son is a patient of mine. She has no known history of previous atrial fibrillation cannot remember when she ever had an EKG in the past but did not have a rapid heart rate when seen by her primary care physician in June.  82  She had an echocardiogram 09/24/2020 showing a low normal ejection fraction 50 to 55% normal left ventricular size and wall thickness.  The right ventricle is normal in size function and there is moderate elevation of pulmonary artery systolic pressure both atria are moderately enlarged there is moderate mitral regurgitation moderate tricuspid regurgitation with mild aortic regurgitation.  Her pulse was not recorded when seen in July in the orthopedic office.  The day she was documented atrial fibrillation with pulse greater than 140 bpm she was unaware.  Unfortunately the comments about shocking her heart have  made her very nervous anxious and she is quite aware of her heart is worsened with higher doses of beta-blocker and just generally does not feel well.  No chest pain edema shortness of breath or syncope.  She has a background history of a gastric ulcer with bleeding years ago decades ago and she is not on GI prophylaxis.  Particularly she is very hesitant to accept cardioversion. Past Medical History:  Diagnosis Date   Anxiety    Arthritis    Gastric ulcer    Hyperlipidemia    Hypertension     Past Surgical History:  Procedure Laterality Date    ABDOMINAL HYSTERECTOMY     CHOLECYSTECTOMY     GYN surgery      Current Medications: Current Meds  Medication Sig   ALPRAZolam (XANAX) 0.25 MG tablet Take 0.125-0.25 mg by mouth 2 (two) times daily as needed for anxiety.   amLODipine (NORVASC) 10 MG tablet Take 5 mg by mouth every morning.   apixaban (ELIQUIS) 5 MG TABS tablet Take 1 tablet (5 mg total) by mouth 2 (two) times daily.   Ascorbic Acid (VITAMIN C) 1000 MG tablet Take 1,000 mg by mouth daily.   benazepril (LOTENSIN) 40 MG tablet Take 40 mg by mouth every morning.   cholecalciferol (VITAMIN D3) 25 MCG (1000 UNIT) tablet Take 1,000 Units by mouth daily.   diltiazem (CARDIZEM) 30 MG tablet Take 1 tablet (30 mg total) by mouth 3 (three) times daily.   Multiple Vitamins-Minerals (CENTRUM SILVER 50+WOMEN) TABS Take by mouth.   omeprazole (PRILOSEC) 20 MG capsule Take 1 capsule (20 mg total) by mouth daily.   Turmeric 500 MG TABS Take 500 mg by mouth daily.   [DISCONTINUED] metoprolol tartrate (LOPRESSOR) 50 MG tablet Take 1 tablet (50 mg total) by mouth 2 (two) times daily.     Allergies:   Codeine   Social History   Socioeconomic History   Marital status: Widowed    Spouse name: Not on file   Number of children: Not on file   Years of education: Not on file   Highest education level: Not on file  Occupational History   Not on file  Tobacco Use   Smoking status: Never   Smokeless tobacco: Never  Vaping Use   Vaping Use: Never used  Substance and Sexual Activity   Alcohol use: Never   Drug use: Never   Sexual activity: Not on file  Other Topics Concern   Not on file  Social History Narrative   Not on file   Social Determinants of Health   Financial Resource Strain: Not on file  Food Insecurity: Not on file  Transportation Needs: Not on file  Physical Activity: Not on file  Stress: Not on file  Social Connections: Not on file     Family History: The patient's family history includes Cancer - Lung in her  brother; Heart attack in her father; Heart disease in her brother and mother. ROS:   Please see the history of present illness.    All other systems reviewed and are negative.  EKGs/Labs/Other Studies Reviewed:    The following studies were reviewed today:  EKG: I independently reviewed the EKGs from the atrial fibrillation clinic  Recent Labs: 09/09/2020: ALT 13; BUN 22; Creatinine, Ser 0.78; Hemoglobin 13.7; Platelets 204; Potassium 4.3; Sodium 140  Recent Lipid Panel No results found for: CHOL, TRIG, HDL, CHOLHDL, VLDL, LDLCALC, LDLDIRECT  Physical Exam:    VS:  BP 140/70 (BP Location: Left  Arm, Patient Position: Sitting, Cuff Size: Normal)   Pulse 74   Ht 5\' 3"  (1.6 m)   Wt 150 lb (68 kg)   SpO2 99%   BMI 26.57 kg/m     Wt Readings from Last 3 Encounters:  10/01/20 150 lb (68 kg)  09/13/20 150 lb 12.8 oz (68.4 kg)  09/09/20 150 lb 3.2 oz (68.1 kg)     GEN:  Well nourished, well developed in no acute distress HEENT: Normal NECK: No JVD; No carotid bruits LYMPHATICS: No lymphadenopathy CARDIAC: Irregular rate and rhythm  no murmurs, rubs, gallops RESPIRATORY:  Clear to auscultation without rales, wheezing or rhonchi  ABDOMEN: Soft, non-tender, non-distended MUSCULOSKELETAL:  No edema; No deformity  SKIN: Warm and dry NEUROLOGIC:  Alert and oriented x 3 PSYCHIATRIC:  Normal affect    Signed, Shirlee More, MD  10/01/2020 2:33 PM    [facility]

## 2020-10-01 NOTE — Patient Instructions (Signed)
Medication Instructions:  Your physician has recommended you make the following change in your medication:  STOP: Metoprolol  START: Cardizem 30 mg take one tablet by mouth three times daily.  START: Prilosec 20 mg take one tablet by mouth daily.  *If you need a refill on your cardiac medications before your next appointment, please call your pharmacy*   Lab Work: None If you have labs (blood work) drawn today and your tests are completely normal, you will receive your results only by: [place] (if you have MyChart) OR A paper copy in the mail If you have any lab test that is abnormal or we need to change your treatment, we will call you to review the results.   Testing/Procedures: A zio monitor was ordered today. It will remain on for 7 days. You will then return monitor and event diary in provided box. It takes 1-2 weeks for report to be downloaded and returned to [place]. We will call you with the results. If monitor falls off or has orange flashing light, please call Zio for further instructions.     Follow-Up: At [facility], you and your health needs are our priority.  As part of our continuing mission to provide you with exceptional heart care, we have created designated Provider Care Teams.  These Care Teams include your primary Cardiologist (physician) and Advanced Practice Providers (APPs -  Physician Assistants and Nurse Practitioners) who all work together to provide you with the care you need, when you need it.  We recommend signing up for the patient portal called "MyChart".  Sign up information is provided on this After Visit Summary.  MyChart is used to connect with patients for Virtual Visits (Telemedicine).  Patients are able to view lab/test results, encounter notes, upcoming appointments, etc.  Non-urgent messages can be sent to your provider as well.   To learn more about what you can do with MyChart, go to [email or web address].    Your next appointment:   1  month(s)  The format for your next appointment:   In Person  Provider:   Shirlee More, MD   Other Instructions

## 2020-10-14 DIAGNOSIS — I4819 Other persistent atrial fibrillation: Secondary | ICD-10-CM | POA: Diagnosis not present

## 2020-10-19 ENCOUNTER — Telehealth: Payer: Self-pay | Admitting: Cardiology

## 2020-10-19 NOTE — Telephone Encounter (Signed)
Patient wants to know should she continue to take her medication for her heart since her monitor results came back okay.

## 2020-10-19 NOTE — Telephone Encounter (Signed)
Spoke to the patient just now and let her know to continue her current medications. She verbalizes understanding.

## 2020-11-05 ENCOUNTER — Ambulatory Visit: Payer: Medicare Other | Admitting: Cardiology

## 2020-11-05 ENCOUNTER — Other Ambulatory Visit: Payer: Self-pay

## 2020-11-05 ENCOUNTER — Encounter: Payer: Self-pay | Admitting: Cardiology

## 2020-11-05 VITALS — BP 142/70 | HR 63 | Ht 63.0 in | Wt 152.0 lb

## 2020-11-05 DIAGNOSIS — K254 Chronic or unspecified gastric ulcer with hemorrhage: Secondary | ICD-10-CM

## 2020-11-05 DIAGNOSIS — I4819 Other persistent atrial fibrillation: Secondary | ICD-10-CM

## 2020-11-05 DIAGNOSIS — I10 Essential (primary) hypertension: Secondary | ICD-10-CM | POA: Diagnosis not present

## 2020-11-05 DIAGNOSIS — D6869 Other thrombophilia: Secondary | ICD-10-CM

## 2020-11-05 NOTE — Progress Notes (Signed)
Cardiology Office Note:    Date:  11/05/2020   ID:  Monica French, DOB [birth year], MRN [id number]  PCP:  Street, Sharon Mt, MD  Cardiologist:  Shirlee More, MD    Referring MD: [address], Sharon Mt, *    ASSESSMENT:    1. Persistent atrial fibrillation ([place])   2. Secondary hypercoagulable state ([place])   3. Primary hypertension   4. Chronic gastric ulcer with hemorrhage    PLAN:    In order of problems listed above:  At this time she is chosen to remain with rate control and anticoagulation I think she is doing well and her CNS side effects from beta-blocker have resolved continue calcium channel blocker anticoagulant Stable BP at target continue ACE inhibitor She will need to stay on long-term PPI therapy with history of gastric ulcer and anticoagulation   Next appointment: 6 months   Medication Adjustments/Labs and Tests Ordered: Current medicines are reviewed at length with the patient today.  Concerns regarding medicines are outlined above.  No orders of the defined types were placed in this encounter.  No orders of the defined types were placed in this encounter.   Chief Complaint  Patient presents with   Follow-up   Atrial Fibrillation    History of Present Illness:    Monica French is a 87 y.o. female with a hx of persistent atrial fibrillation hypertension hyperlipidemia history of gastric ulcer last seen 10/01/2020 and she opted for rate control and anticoagulation.  Compliance with diet, lifestyle and medications: Yes Her son is present participates in the evaluation and decision making Overall she is much better off beta-blocker heart rate is controlled and her predominant problem is minimal ambulation pain and she is decided to push off total joint replacement for now She has had no symptoms of heartburn or indigestion continues on a PPI and with anticoagulation and history of gastric ulcer She has had no bleeding chest pain edema shortness of  breath. We reviewed the options for treatment including rate control and anticoagulation versus cardioversion versus EP catheter ablation.  She prefers a conservative course and will continue rate limiting calcium channel blocker anticoagulant and PPI.  A 7-day event monitor reported 10/17/2020 showed persistent atrial fibrillation with good heart rate control.  She had an echocardiogram 09/24/2020 showing a low normal ejection fraction 50 to 55% normal left ventricular size and wall thickness.  The right ventricle is normal in size function and there is moderate elevation of pulmonary artery systolic pressure both atria are moderately enlarged there is moderate mitral regurgitation moderate tricuspid regurgitation with mild aortic regurgitation  Past Medical History:  Diagnosis Date   Anxiety    Arthritis    Gastric ulcer    Hyperlipidemia    Hypertension     Past Surgical History:  Procedure Laterality Date   ABDOMINAL HYSTERECTOMY     CHOLECYSTECTOMY     GYN surgery      Current Medications: Current Meds  Medication Sig   ALPRAZolam (XANAX) 0.25 MG tablet Take 0.125-0.25 mg by mouth 2 (two) times daily as needed for anxiety.   apixaban (ELIQUIS) 5 MG TABS tablet Take 1 tablet (5 mg total) by mouth 2 (two) times daily.   Ascorbic Acid (VITAMIN C) 1000 MG tablet Take 1,000 mg by mouth daily.   benazepril (LOTENSIN) 40 MG tablet Take 40 mg by mouth every morning.   cholecalciferol (VITAMIN D3) 25 MCG (1000 UNIT) tablet Take 1,000 Units by mouth daily.   diltiazem (CARDIZEM)  30 MG tablet Take 1 tablet (30 mg total) by mouth 3 (three) times daily.   Multiple Vitamins-Minerals (CENTRUM SILVER 50+WOMEN) TABS Take by mouth.   omeprazole (PRILOSEC) 20 MG capsule Take 1 capsule (20 mg total) by mouth daily.   Turmeric 500 MG TABS Take 500 mg by mouth daily.     Allergies:   Codeine   Social History   Socioeconomic History   Marital status: Widowed    Spouse name: Not on file    Number of children: Not on file   Years of education: Not on file   Highest education level: Not on file  Occupational History   Not on file  Tobacco Use   Smoking status: Never   Smokeless tobacco: Never  Vaping Use   Vaping Use: Never used  Substance and Sexual Activity   Alcohol use: Never   Drug use: Never   Sexual activity: Not on file  Other Topics Concern   Not on file  Social History Narrative   Not on file   Social Determinants of Health   Financial Resource Strain: Not on file  Food Insecurity: Not on file  Transportation Needs: Not on file  Physical Activity: Not on file  Stress: Not on file  Social Connections: Not on file     Family History: The patient's family history includes Cancer - Lung in her brother; Heart attack in her father; Heart disease in her brother and mother. ROS:   Please see the history of present illness.    All other systems reviewed and are negative.  EKGs/Labs/Other Studies Reviewed:    The following studies were reviewed today:  Event monitor: Study Highlights    Patch Wear Time:  6 days and 18 hours (2022-09-30T14:32:36-0400 to 2022-10-07T09:05:28-0400)   Atrial Fibrillation occurred continuously (100% burden), ranging from 42-174 bpm (avg of 82 bpm). Isolated VEs were rare (<1.0%), and no VE Couplets or VE Triplets were present.   The cardiac rhythm throughout was atrial fibrillation The average heart rate 82 bpm Heart rate was in range 50 to 110 bpm 90% of the time day 10% of the time 110 to 151% of the time greater than 150 bpm. Heart rate was in range 5210 bpm 100% of the time at night There were no pauses of 3 seconds or greater and no sustained slow response to atrial fibrillation Ventricular ectopy was rare without couplets or triplets There was 1 triggered event controlled atrial fibrillation There was 1 diary event with rapid ventricular rate under 112-157 bpm     Conclusion atrial fibrillation in general the  heart rate is well controlled Recent Labs: 09/09/2020: ALT 13; BUN 22; Creatinine, Ser 0.78; Hemoglobin 13.7; Platelets 204; Potassium 4.3; Sodium 140    Physical Exam:    VS:  BP (!) 142/70 (BP Location: Right Arm, Patient Position: Sitting, Cuff Size: Normal)   Pulse 63   Ht 5\' 3"  (1.6 m)   Wt 152 lb (68.9 kg)   SpO2 98%   BMI 26.93 kg/m     Wt Readings from Last 3 Encounters:  11/05/20 152 lb (68.9 kg)  10/01/20 150 lb (68 kg)  09/13/20 150 lb 12.8 oz (68.4 kg)     GEN:  Well nourished, well developed in no acute distress HEENT: Normal NECK: No JVD; No carotid bruits LYMPHATICS: No lymphadenopathy CARDIAC: Irregular rate and rhythm no murmurs, rubs, gallops RESPIRATORY:  Clear to auscultation without rales, wheezing or rhonchi  ABDOMEN: Soft, non-tender, non-distended MUSCULOSKELETAL:  No  edema; No deformity  SKIN: Warm and dry NEUROLOGIC:  Alert and oriented x 3 PSYCHIATRIC:  Normal affect    Signed, Shirlee More, MD  11/05/2020 1:23 PM    [facility]

## 2020-11-05 NOTE — Patient Instructions (Signed)

## 2020-11-30 DIAGNOSIS — M170 Bilateral primary osteoarthritis of knee: Secondary | ICD-10-CM | POA: Diagnosis not present

## 2020-12-07 DIAGNOSIS — M170 Bilateral primary osteoarthritis of knee: Secondary | ICD-10-CM | POA: Diagnosis not present

## 2020-12-13 ENCOUNTER — Telehealth: Payer: Self-pay | Admitting: Cardiology

## 2020-12-13 NOTE — Telephone Encounter (Signed)
Pt states that the pain is noted in her back and wrist. Pt has been on both medications since September. Pt has a hx of arthritis. Pt is not on any type of statin. How do you advise?

## 2020-12-13 NOTE — Telephone Encounter (Signed)
Pt c/o medication issue:  1. Name of Medication: Eliquis and Diltiazem  2. How are you currently taking this medication (dosage and times per day)? Eliquis 2 times a day and Diltiazem 3 times a day  3. Are you having a reaction (difficulty breathing--STAT)? no  4. What is your medication issue? Joint and muscle pains- not sure if this medicine is causing this

## 2020-12-13 NOTE — Telephone Encounter (Signed)
Recommendations reviewed with pt as per Dr. Munley's note.  Pt verbalized understanding and had no additional questions.  

## 2020-12-20 ENCOUNTER — Telehealth: Payer: Self-pay | Admitting: Cardiology

## 2020-12-20 DIAGNOSIS — N3090 Cystitis, unspecified without hematuria: Secondary | ICD-10-CM | POA: Diagnosis not present

## 2020-12-20 DIAGNOSIS — R300 Dysuria: Secondary | ICD-10-CM | POA: Diagnosis not present

## 2020-12-20 NOTE — Telephone Encounter (Signed)
Left message on patients voicemail to please return our call.   

## 2020-12-20 NOTE — Telephone Encounter (Signed)
Spoke to the patient just now and let her know Dr. Joya Gaskins recommendations. She verbalizes understanding and thanks me for the call back.

## 2020-12-20 NOTE — Telephone Encounter (Signed)
Spoke to the patient just now and she let me know that she is having joint pain when she first wakes up in the morning. I advised that the joint pain she is having should not be from her Eliquis, especially since she has been taking this since September with no issues until now. I advised that she see her PCP for this joint pain.   She is very certain that it is from her Eliquis as she tells me that she has been on the computer doing her own research and saw that many people on Eliquis are having the same joint pain that she is.   I will route to Dr. Bettina Gavia for his advice on this.

## 2020-12-20 NOTE — Telephone Encounter (Signed)
Pt c/o medication issue:  1. Name of Medication: apixaban (ELIQUIS) 5 MG TABS tablet  2. How are you currently taking this medication (dosage and times per day)? Take 1 tablet (5 mg total) by mouth 2 (two) times daily.  3. Are you having a reaction (difficulty breathing--STAT)? Possible side effects of medication   4. What is your medication issue? joint pain in arms hands and right side of her back down her waist when she first wakes up... sharp pain when she takes a deep breath. Thinks this is a side effect of Eliquis medication.

## 2020-12-22 DIAGNOSIS — R0782 Intercostal pain: Secondary | ICD-10-CM | POA: Diagnosis not present

## 2020-12-22 DIAGNOSIS — R091 Pleurisy: Secondary | ICD-10-CM | POA: Diagnosis not present

## 2020-12-28 ENCOUNTER — Other Ambulatory Visit (HOSPITAL_COMMUNITY): Payer: Self-pay | Admitting: Cardiology

## 2020-12-28 ENCOUNTER — Other Ambulatory Visit (HOSPITAL_COMMUNITY): Payer: Self-pay | Admitting: Physician Assistant

## 2020-12-28 NOTE — Telephone Encounter (Signed)
Prescription refill request for Eliquis received. Indication: Afib  Last office visit:11/05/20 ([place])  Scr: 0.78 (09/09/20 Age: 87 Weight: 68.9kg  Appropriate dose and refill sent to requested pharmacy

## 2021-02-02 DIAGNOSIS — I4891 Unspecified atrial fibrillation: Secondary | ICD-10-CM | POA: Diagnosis not present

## 2021-02-02 DIAGNOSIS — R42 Dizziness and giddiness: Secondary | ICD-10-CM | POA: Diagnosis not present

## 2021-02-02 DIAGNOSIS — I10 Essential (primary) hypertension: Secondary | ICD-10-CM | POA: Diagnosis not present

## 2021-02-03 ENCOUNTER — Telehealth: Payer: Self-pay | Admitting: Cardiology

## 2021-02-03 NOTE — Telephone Encounter (Signed)
STAT if patient feels like he/she is going to faint   Are you dizzy now?  Patient's son states he is not currently with the patient. Do you feel faint or have you passed out?  No  Do you have any other symptoms?  No  Have you checked your HR and BP (record if available)?   2/2: 185/114 91 2/1: 194/120         160/98        140's/88        208/100  Patient's son states the patient went to the hospital yesterday due to elevated BP and dizziness. He states he is concerned because they didn't make any changes, but he thinks her BP medication may need to be adjusted. Please advise.

## 2021-02-03 NOTE — Telephone Encounter (Signed)
Recommendations reviewed with pt's son as Insurance claims handler Dr. Joya Gaskins note. Pt verbalized understanding and had no additional questions.

## 2021-02-03 NOTE — Telephone Encounter (Signed)
Spoke with pt's son Kathreen Cosier per DPR that states his mom's BP has been elevated and she was seen [facility] yesterday. The records have been printed and are ready for you to review. Please advise.

## 2021-02-04 DIAGNOSIS — E785 Hyperlipidemia, unspecified: Secondary | ICD-10-CM | POA: Diagnosis not present

## 2021-02-04 DIAGNOSIS — I10 Essential (primary) hypertension: Secondary | ICD-10-CM | POA: Diagnosis not present

## 2021-02-04 DIAGNOSIS — I482 Chronic atrial fibrillation, unspecified: Secondary | ICD-10-CM | POA: Diagnosis not present

## 2021-02-04 DIAGNOSIS — G720 Drug-induced myopathy: Secondary | ICD-10-CM | POA: Diagnosis not present

## 2021-02-04 DIAGNOSIS — M353 Polymyalgia rheumatica: Secondary | ICD-10-CM | POA: Diagnosis not present

## 2021-02-04 DIAGNOSIS — D6869 Other thrombophilia: Secondary | ICD-10-CM | POA: Diagnosis not present

## 2021-02-04 DIAGNOSIS — R0989 Other specified symptoms and signs involving the circulatory and respiratory systems: Secondary | ICD-10-CM | POA: Diagnosis not present

## 2021-02-04 DIAGNOSIS — D692 Other nonthrombocytopenic purpura: Secondary | ICD-10-CM | POA: Diagnosis not present

## 2021-02-10 ENCOUNTER — Telehealth: Payer: Self-pay | Admitting: Cardiology

## 2021-02-10 ENCOUNTER — Emergency Department (HOSPITAL_COMMUNITY): Payer: Medicare Other

## 2021-02-10 ENCOUNTER — Other Ambulatory Visit: Payer: Self-pay

## 2021-02-10 ENCOUNTER — Inpatient Hospital Stay (HOSPITAL_COMMUNITY)
Admission: EM | Admit: 2021-02-10 | Discharge: 2021-02-13 | DRG: 641 | Disposition: A | Discharge status: Home Health | Payer: Medicare Other | Attending: Internal Medicine | Admitting: Internal Medicine

## 2021-02-10 ENCOUNTER — Encounter (HOSPITAL_COMMUNITY): Payer: Self-pay | Admitting: Emergency Medicine

## 2021-02-10 DIAGNOSIS — F0390 Unspecified dementia without behavioral disturbance: Secondary | ICD-10-CM

## 2021-02-10 DIAGNOSIS — Z6827 Body mass index (BMI) 27.0-27.9, adult: Secondary | ICD-10-CM

## 2021-02-10 DIAGNOSIS — E785 Hyperlipidemia, unspecified: Secondary | ICD-10-CM | POA: Diagnosis not present

## 2021-02-10 DIAGNOSIS — R531 Weakness: Secondary | ICD-10-CM

## 2021-02-10 DIAGNOSIS — Z885 Allergy status to narcotic agent status: Secondary | ICD-10-CM | POA: Diagnosis not present

## 2021-02-10 DIAGNOSIS — Z7901 Long term (current) use of anticoagulants: Secondary | ICD-10-CM | POA: Diagnosis not present

## 2021-02-10 DIAGNOSIS — F419 Anxiety disorder, unspecified: Secondary | ICD-10-CM | POA: Diagnosis not present

## 2021-02-10 DIAGNOSIS — N179 Acute kidney failure, unspecified: Secondary | ICD-10-CM | POA: Diagnosis not present

## 2021-02-10 DIAGNOSIS — Z20822 Contact with and (suspected) exposure to covid-19: Secondary | ICD-10-CM | POA: Diagnosis not present

## 2021-02-10 DIAGNOSIS — R42 Dizziness and giddiness: Secondary | ICD-10-CM

## 2021-02-10 DIAGNOSIS — T502X5A Adverse effect of carbonic-anhydrase inhibitors, benzothiadiazides and other diuretics, initial encounter: Secondary | ICD-10-CM | POA: Diagnosis present

## 2021-02-10 DIAGNOSIS — R197 Diarrhea, unspecified: Secondary | ICD-10-CM | POA: Diagnosis present

## 2021-02-10 DIAGNOSIS — I10 Essential (primary) hypertension: Secondary | ICD-10-CM | POA: Diagnosis not present

## 2021-02-10 DIAGNOSIS — Z79899 Other long term (current) drug therapy: Secondary | ICD-10-CM

## 2021-02-10 DIAGNOSIS — I1A0 Resistant hypertension: Secondary | ICD-10-CM

## 2021-02-10 DIAGNOSIS — I6381 Other cerebral infarction due to occlusion or stenosis of small artery: Secondary | ICD-10-CM | POA: Diagnosis not present

## 2021-02-10 DIAGNOSIS — Z90710 Acquired absence of both cervix and uterus: Secondary | ICD-10-CM

## 2021-02-10 DIAGNOSIS — M1990 Unspecified osteoarthritis, unspecified site: Secondary | ICD-10-CM | POA: Diagnosis not present

## 2021-02-10 DIAGNOSIS — R17 Unspecified jaundice: Secondary | ICD-10-CM | POA: Diagnosis not present

## 2021-02-10 DIAGNOSIS — E871 Hypo-osmolality and hyponatremia: Principal | ICD-10-CM | POA: Diagnosis present

## 2021-02-10 DIAGNOSIS — K259 Gastric ulcer, unspecified as acute or chronic, without hemorrhage or perforation: Secondary | ICD-10-CM | POA: Diagnosis not present

## 2021-02-10 DIAGNOSIS — Z8249 Family history of ischemic heart disease and other diseases of the circulatory system: Secondary | ICD-10-CM

## 2021-02-10 DIAGNOSIS — I4821 Permanent atrial fibrillation: Secondary | ICD-10-CM | POA: Diagnosis present

## 2021-02-10 DIAGNOSIS — I4819 Other persistent atrial fibrillation: Secondary | ICD-10-CM | POA: Diagnosis not present

## 2021-02-10 DIAGNOSIS — F0394 Unspecified dementia, unspecified severity, with anxiety: Secondary | ICD-10-CM | POA: Diagnosis not present

## 2021-02-10 DIAGNOSIS — I4891 Unspecified atrial fibrillation: Secondary | ICD-10-CM | POA: Diagnosis not present

## 2021-02-10 DIAGNOSIS — E663 Overweight: Secondary | ICD-10-CM | POA: Diagnosis present

## 2021-02-10 DIAGNOSIS — R410 Disorientation, unspecified: Secondary | ICD-10-CM

## 2021-02-10 DIAGNOSIS — K254 Chronic or unspecified gastric ulcer with hemorrhage: Secondary | ICD-10-CM | POA: Diagnosis not present

## 2021-02-10 HISTORY — DX: Hypo-osmolality and hyponatremia: E87.1

## 2021-02-10 LAB — COMPREHENSIVE METABOLIC PANEL
ALT: 13 U/L (ref 0–44)
AST: 32 U/L (ref 15–41)
Albumin: 3.9 g/dL (ref 3.5–5.0)
Alkaline Phosphatase: 71 U/L (ref 38–126)
Anion gap: 11 (ref 5–15)
BUN: 14 mg/dL (ref 8–23)
CO2: 23 mmol/L (ref 22–32)
Calcium: 9.9 mg/dL (ref 8.9–10.3)
Chloride: 88 mmol/L — ABNORMAL LOW (ref 98–111)
Creatinine, Ser: 0.86 mg/dL (ref 0.44–1.00)
GFR, Estimated: >60 mL/min (ref >60)
Glucose, Bld: 108 mg/dL — ABNORMAL HIGH (ref 70–99)
Potassium: 4.3 mmol/L (ref 3.5–5.1)
Sodium: 122 mmol/L — ABNORMAL LOW (ref 135–145)
Total Bilirubin: 1.5 mg/dL — ABNORMAL HIGH (ref 0.3–1.2)
Total Protein: 6.6 g/dL (ref 6.5–8.1)

## 2021-02-10 LAB — URINALYSIS, ROUTINE W REFLEX MICROSCOPIC
Bilirubin Urine: NEGATIVE
Glucose, UA: NEGATIVE mg/dL
Ketones, ur: NEGATIVE mg/dL
Nitrite: NEGATIVE
Protein, ur: NEGATIVE mg/dL
Specific Gravity, Urine: 1.012 (ref 1.005–1.030)
pH: 6 (ref 5.0–8.0)

## 2021-02-10 LAB — OSMOLALITY, URINE: Osmolality, Ur: 425 mOsm/kg (ref 300–900)

## 2021-02-10 LAB — CBC WITH DIFFERENTIAL/PLATELET
Abs Immature Granulocytes: 0.04 10*3/uL (ref 0.00–0.07)
Basophils Absolute: 0 10*3/uL (ref 0.0–0.1)
Basophils Relative: 0 %
Eosinophils Absolute: 0.1 10*3/uL (ref 0.0–0.5)
Eosinophils Relative: 1 %
HCT: 43.9 % (ref 36.0–46.0)
Hemoglobin: 14.7 g/dL (ref 12.0–15.0)
Immature Granulocytes: 1 %
Lymphocytes Relative: 18 %
Lymphs Abs: 1.2 10*3/uL (ref 0.7–4.0)
MCH: 29.2 pg (ref 26.0–34.0)
MCHC: 33.5 g/dL (ref 30.0–36.0)
MCV: 87.1 fL (ref 80.0–100.0)
Monocytes Absolute: 0.5 10*3/uL (ref 0.1–1.0)
Monocytes Relative: 7 %
Neutro Abs: 4.9 10*3/uL (ref 1.7–7.7)
Neutrophils Relative %: 73 %
Platelets: 241 10*3/uL (ref 150–400)
RBC: 5.04 MIL/uL (ref 3.87–5.11)
RDW: 14.2 % (ref 11.5–15.5)
WBC: 6.7 10*3/uL (ref 4.0–10.5)
nRBC: 0 % (ref 0.0–0.2)

## 2021-02-10 LAB — RESP PANEL BY RT-PCR (FLU A&B, COVID) ARPGX2
Influenza A by PCR: NEGATIVE
Influenza B by PCR: NEGATIVE
SARS Coronavirus 2 by RT PCR: NEGATIVE

## 2021-02-10 LAB — SODIUM, URINE, RANDOM: Sodium, Ur: 75 mmol/L

## 2021-02-10 LAB — MAGNESIUM: Magnesium: 1.9 mg/dL (ref 1.7–2.4)

## 2021-02-10 LAB — OSMOLALITY: Osmolality: 254 mOsm/kg — ABNORMAL LOW (ref 275–295)

## 2021-02-10 MED ORDER — SODIUM CHLORIDE 0.9 % IV SOLN: Freq: Once | INTRAVENOUS | Status: AC

## 2021-02-10 NOTE — ED Triage Notes (Signed)
Pt here from home with c/o htn along with some nausea and diarrhea , was at [facility] last wed for same , and started on meds for htn on Friday along with clonidine on Tuesday of this week

## 2021-02-10 NOTE — ED Provider Triage Note (Signed)
Emergency Medicine Provider Triage Evaluation Note  Monica French , a 88 y.o. female  was evaluated in triage.  Pt complains of dizziness and feeling generally unwell.  History provided largely by patient's family member at bedside who states that she seems not herself, somewhat confused compared to usual for the past week.  Patient was seen at [facility] ER the day her symptoms started, had a CT of her head as well as lab work-up and was advised to follow-up with her PCP or see cardiology.  Patient's cardiologist deferred to PCP who saw her on Friday and started her on a new blood pressure medication.  Patient return to the office with ongoing elevated blood pressure readings and continuing to feel unwell and was started on clonidine. History of A-fib diagnosed in September of last year. Review of Systems  Positive: Change in mental status Negative: Fevers, chills, chest pain or shortness of breath  Physical Exam  BP (!) 116/45 (BP Location: Left Arm)    Pulse (!) 145    Temp 98.3 F (36.8 C) (Oral)    Resp 16    SpO2 96%  Gen:   Awake, no distress   Resp:  Normal effort  MSK:   Moves extremities without difficulty  Other:  Rate recheck, 90s  Medical Decision Making  Medically screening exam initiated at 5:57 PM.  Appropriate orders placed.  ILHAM ROUGHTON was informed that the remainder of the evaluation will be completed by another provider, this initial triage assessment does not replace that evaluation, and the importance of remaining in the ED until their evaluation is complete.     Tacy Learn, PA-C 02/10/21 1759

## 2021-02-10 NOTE — ED Provider Notes (Signed)
[facility] EMERGENCY DEPARTMENT  Provider Note  CSN: [id number] Arrival date & time: 02/10/21 1641  History Chief Complaint  Patient presents with   Hypertension    Monica French is a 88 y.o. female with history of HTN, Afib on Eliquis has not been feeling well for over a week. She has had generalized weakness, dizziness and poor appetite. She was seen at [facility] ED last week for same and had a reportedly negative workup although BP was elevated. She was started on some new medication but she is not sure the name, she is not on a diuretic but she has been drinking more water as she was advised. Probably 30-40oz per day. She continued to feel poorly so she came to the ED here today with her son who helps supplement the history.    Home Medications Prior to Admission medications   Medication Sig Start Date End Date Taking? Authorizing Provider  ALPRAZolam (XANAX) 0.25 MG tablet Take 0.125-0.25 mg by mouth 2 (two) times daily as needed for anxiety. 06/11/20   [provider]  Ascorbic Acid (VITAMIN C) 1000 MG tablet Take 1,000 mg by mouth daily.    [provider]  benazepril (LOTENSIN) 40 MG tablet Take 40 mg by mouth every morning. 06/11/20   [provider]  cholecalciferol (VITAMIN D3) 25 MCG (1000 UNIT) tablet Take 1,000 Units by mouth daily.    [provider]  diltiazem (CARDIZEM) 30 MG tablet Take 1 tablet (30 mg total) by mouth 3 (three) times daily. 10/01/20   Richardo Priest, MD  ELIQUIS 5 MG TABS tablet TAKE 1 TABLET(5 MG) BY MOUTH TWICE DAILY 12/28/20   Richardo Priest, MD  Multiple Vitamins-Minerals (CENTRUM SILVER 50+WOMEN) TABS Take by mouth.    [provider]  omeprazole (PRILOSEC) 20 MG capsule Take 1 capsule (20 mg total) by mouth daily. 10/01/20   Richardo Priest, MD  Turmeric 500 MG TABS Take 500 mg by mouth daily.    [provider]     Allergies    Codeine   Review of Systems   Review of  Systems Please see HPI for pertinent positives and negatives  Physical Exam BP (!) 184/102 (BP Location: Right Arm)    Pulse 86    Temp 98.3 F (36.8 C) (Oral)    Resp 18    SpO2 100%   Physical Exam Vitals and nursing note reviewed.  Constitutional:      Appearance: Normal appearance.  HENT:     Head: Normocephalic and atraumatic.     Nose: Nose normal.     Mouth/Throat:     Mouth: Mucous membranes are moist.  Eyes:     Extraocular Movements: Extraocular movements intact.     Conjunctiva/sclera: Conjunctivae normal.  Cardiovascular:     Rate and Rhythm: Normal rate.  Pulmonary:     Effort: Pulmonary effort is normal.     Breath sounds: Normal breath sounds.  Abdominal:     General: Abdomen is flat.     Palpations: Abdomen is soft.     Tenderness: There is no abdominal tenderness.  Musculoskeletal:        General: No swelling. Normal range of motion.     Cervical back: Neck supple.  Skin:    General: Skin is warm and dry.  Neurological:     General: No focal deficit present.     Mental Status: She is alert and oriented to person, place, and time.  Cranial Nerves: No cranial nerve deficit.     Sensory: No sensory deficit.     Motor: No weakness.  Psychiatric:        Mood and Affect: Mood normal.    ED Results / Procedures / Treatments   EKG EKG Interpretation  Date/Time:  Thursday February 10 2021 17:41:11 EST Ventricular Rate:  94 PR Interval:    QRS Duration: 88 QT Interval:  342 QTC Calculation: 427 R Axis:   138 Text Interpretation: Atrial fibrillation Right axis deviation Possible Anterior infarct , age undetermined Abnormal ECG When compared with ECG of 13-Sep-2020 14:06, No significant change since last tracing Confirmed by Calvert Cantor [id number]) on 02/10/2021 9:17:04 PM  Procedures Procedures  Medications Ordered in the ED Medications  0.9 %  sodium chloride infusion ( Intravenous New Bag/Given 02/10/21 2149)    Initial Impression and Plan   Patient with generalized weakness, dizziness and poor appetite here for re-evaluation after recent visit to [facility] ED. She had labs and CT done in triage, CBC is normal. CT head without acute change from previous. UA without signs of infection, not particularly dilute. CMP shows moderate hyponatremia may be contributing to her symptoms. Will begin saline infusion at a low rate. Discuss with medicine for admission. Patient and son in agreement.   ED Course   Clinical Course as of 02/10/21 2245  Thu Feb 10, 2021  2145 Spoke with Dr. Flossie Buffy, Hospitalist, who will evaluate the patient for admission.  [CS]    Clinical Course User Index [CS] Truddie Hidden, MD     MDM Rules/Calculators/A&P Medical Decision Making Problems Addressed: Generalized weakness: acute illness or injury that poses a threat to life or bodily functions Hyponatremia: acute illness or injury that poses a threat to life or bodily functions  Amount and/or Complexity of Data Reviewed Labs: ordered. Decision-making details documented in ED Course. Radiology: ordered and independent interpretation performed. Decision-making details documented in ED Course. ECG/medicine tests: ordered and independent interpretation performed. Decision-making details documented in ED Course.  Risk Prescription drug management. Decision regarding hospitalization.    Final Clinical Impression(s) / ED Diagnoses Final diagnoses:  Hyponatremia  Generalized weakness    Rx / DC Orders ED Discharge Orders     None        Truddie Hidden, MD 02/10/21 2245

## 2021-02-10 NOTE — H&P (Signed)
History and Physical    Patient: Monica French DIY:[id number] DOB: 1931-08-18 DOA: 02/10/2021 DOS: the patient was seen and examined on 02/10/2021 PCP: Street, Sharon Mt, MD  Patient coming from: Home  Chief Complaint:  Chief Complaint  Patient presents with   Hypertension    HPI: Monica French is a 88 y.o. female with medical history significant of persistent atrial fibrillation on Eliquis, hypertension, gastric ulcer, anxiety and hyperlipidemia who presents with concerns of dizziness and elevated blood pressure.  About a week ago she began to note acute dizziness and nausea with movement.  She presented to [facility] and reportedly was told that everything was okay.  Unfortunately there is no documentation in care everywhere.  She also noted fluctuating blood pressure ranging from systolic of 076K down to around 120.  She is on benazepril and diltiazem chronically and blood pressures previously been stable.  She saw her PCP and was started on triamterene-HCTZ on 2/3 and then had the addition of clonidine 0.1 mg on 2/7 when her blood pressure did not improve.  She reports continuing to feel weak with decreased appetite.  Feels nauseous but no vomiting.  Had 2 episodes of diarrhea today.  No abdominal pain.  Has felt some intermittent bilateral facial numbness.  She also has been feeling intermittent heart palpitations but no chest pain.  In the ED, she was afebrile and with blood pressure ranging from systolic of 088 to  110.  She remains in atrial fibrillation with rates around 80-90.  Labs notable for hyponatremia of 122, K of 4.3, BG of 108.  Creatinine of 0.86.  CBC unremarkable. Total bilirubin mildly elevated 1.5.  CT head negative   Review of Systems: As mentioned in the history of present illness. All other systems reviewed and are negative. Past Medical History:  Diagnosis Date   Anxiety    Arthritis    Gastric ulcer    Hyperlipidemia    Hypertension    Past  Surgical History:  Procedure Laterality Date   ABDOMINAL HYSTERECTOMY     CHOLECYSTECTOMY     GYN surgery     Social History:  reports that she has never smoked. She has never used smokeless tobacco. She reports that she does not drink alcohol and does not use drugs.  Allergies  Allergen Reactions   Codeine Nausea And Vomiting    Family History  Problem Relation Age of Onset   Heart disease Mother    Heart attack Father    Heart disease Brother    Cancer - Lung Brother     Prior to Admission medications   Medication Sig Start Date End Date Taking? Authorizing Provider  ALPRAZolam (XANAX) 0.25 MG tablet Take 0.125-0.25 mg by mouth 2 (two) times daily as needed for anxiety. 06/11/20   [provider]  Ascorbic Acid (VITAMIN C) 1000 MG tablet Take 1,000 mg by mouth daily.    [provider]  benazepril (LOTENSIN) 40 MG tablet Take 40 mg by mouth every morning. 06/11/20   [provider]  cholecalciferol (VITAMIN D3) 25 MCG (1000 UNIT) tablet Take 1,000 Units by mouth daily.    [provider]  diltiazem (CARDIZEM) 30 MG tablet Take 1 tablet (30 mg total) by mouth 3 (three) times daily. 10/01/20   Richardo Priest, MD  ELIQUIS 5 MG TABS tablet TAKE 1 TABLET(5 MG) BY MOUTH TWICE DAILY 12/28/20   Richardo Priest, MD  Multiple Vitamins-Minerals (CENTRUM SILVER 50+WOMEN) TABS Take by mouth.  [provider]  omeprazole (PRILOSEC) 20 MG capsule Take 1 capsule (20 mg total) by mouth daily. 10/01/20   Richardo Priest, MD  Turmeric 500 MG TABS Take 500 mg by mouth daily.    [provider]    Physical Exam: Vitals:   02/10/21 1750 02/10/21 2052  BP: (!) 116/45 (!) 184/102  Pulse: (!) 145 86  Resp: 16 18  Temp: 98.3 F (36.8 C)   TempSrc: Oral   SpO2: 96% 100%   Constitutional: NAD, calm, comfortable, thin elderly female laying flat in bed Eyes: PERRL, lids and conjunctivae normal ENMT: Mucous membranes are moist.  Neck: normal,  supple Respiratory: clear to auscultation bilaterally, no wheezing, no crackles. Normal respiratory effort. No accessory muscle use.  Cardiovascular: Irregularly irregular rate and rhythm, no murmurs / rubs / gallops. No extremity edema. Abdomen: no tenderness, no masses palpated.  Bowel sounds positive.  Musculoskeletal: no clubbing / cyanosis. No joint deformity upper and lower extremities. Good ROM, no contractures. Normal muscle tone.  Skin: no rashes, lesions, ulcers. No induration Neurologic: CN 2-12 grossly intact. Sensation intact Strength 5/5 in all 4.  Psychiatric: Normal judgment and insight. Alert and oriented x 3. Normal mood.  Data Reviewed:  EKG showing atrial fibrillation with right axis deviation. CT head negative  Assessment and Plan:  Hyponatremia Sodium of 122.  Osmolality labs are pending. -Suspect secondary to decreased appetite and being started on diuretic last week -Continue IV 75 cc normal saline fluid.  Repeat sodium around midnight to follow.   Resistant hypertension -Blood pressure are ranging from normotensive to being elevated up to 180 at home despite being on benazepril, diltiazem, and recently added triamterene/HCTZ and clonidine. -Will continue with the exception of triamterene/HCTZ due to acute hyponatremia -creatinine and UA are normal not suggestive of renovascular disease -check repeat Echo , TSH  -last echo on 09/2020 with EF of 50 to 55% with moderately elevated pulmonary artery systolic pressure, both atriums were moderately dilated, moderate mitral valve regurgitation.  Mild aortic valve regurgitation.  Persistent atrial fibrillation -Has been feeling palpitation recently.  Suspect her symptoms could possibility be due to atrial fibrillation with RVR -keep on continuous telemetry -Check echocardiogram, TSH as above - Continue Eliquis and diltiazem    Advance Care Planning: Code status:Full  Consults: none  Family Communication:  Discussed with the patient and granddaughter at bedside.  Severity of Illness: The appropriate patient status for this patient is OBSERVATION. Observation status is judged to be reasonable and necessary in order to provide the required intensity of service to ensure the patient's safety. The patient's presenting symptoms, physical exam findings, and initial radiographic and laboratory data in the context of their medical condition is felt to place them at decreased risk for further clinical deterioration. Furthermore, it is anticipated that the patient will be medically stable for discharge from the hospital within 2 midnights of admission.   Author: Orene Desanctis, DO 02/10/2021 9:47 PM  For on call review [email or web address].

## 2021-02-10 NOTE — Telephone Encounter (Signed)
Son is calling to say that he is taking the patient to [facility] hospital and he wanted Dr, Bettina Gavia to know. Please advise

## 2021-02-10 NOTE — Telephone Encounter (Signed)
Spoke with the patient's son who states that the patient has continued to feel poorly. She did see her PCP last week as advised by Dr. Bettina Gavia. He states that she was started on clonidine but has not had any improvement in her BP. She is still getting readings in 170s/90s and 180s/100s. Patient has been feeling dizzy, jaw pain, and weakness. Son is very concerned about the patient and is currently on the way to take her to [facility] ER. Advised that I would make Dr. Bettina Gavia aware.

## 2021-02-11 ENCOUNTER — Observation Stay (HOSPITAL_COMMUNITY): Payer: Medicare Other

## 2021-02-11 DIAGNOSIS — F419 Anxiety disorder, unspecified: Secondary | ICD-10-CM

## 2021-02-11 DIAGNOSIS — Z8249 Family history of ischemic heart disease and other diseases of the circulatory system: Secondary | ICD-10-CM | POA: Diagnosis not present

## 2021-02-11 DIAGNOSIS — I1A0 Resistant hypertension: Secondary | ICD-10-CM

## 2021-02-11 DIAGNOSIS — Z20822 Contact with and (suspected) exposure to covid-19: Secondary | ICD-10-CM | POA: Diagnosis present

## 2021-02-11 DIAGNOSIS — I10 Essential (primary) hypertension: Secondary | ICD-10-CM | POA: Diagnosis present

## 2021-02-11 DIAGNOSIS — I4891 Unspecified atrial fibrillation: Secondary | ICD-10-CM | POA: Diagnosis not present

## 2021-02-11 DIAGNOSIS — N179 Acute kidney failure, unspecified: Secondary | ICD-10-CM | POA: Diagnosis present

## 2021-02-11 DIAGNOSIS — E871 Hypo-osmolality and hyponatremia: Secondary | ICD-10-CM | POA: Diagnosis present

## 2021-02-11 DIAGNOSIS — R197 Diarrhea, unspecified: Secondary | ICD-10-CM | POA: Diagnosis present

## 2021-02-11 DIAGNOSIS — F0394 Unspecified dementia, unspecified severity, with anxiety: Secondary | ICD-10-CM | POA: Diagnosis present

## 2021-02-11 DIAGNOSIS — Z7901 Long term (current) use of anticoagulants: Secondary | ICD-10-CM | POA: Diagnosis not present

## 2021-02-11 DIAGNOSIS — M1990 Unspecified osteoarthritis, unspecified site: Secondary | ICD-10-CM | POA: Diagnosis present

## 2021-02-11 DIAGNOSIS — E785 Hyperlipidemia, unspecified: Secondary | ICD-10-CM | POA: Diagnosis present

## 2021-02-11 DIAGNOSIS — R42 Dizziness and giddiness: Secondary | ICD-10-CM | POA: Diagnosis present

## 2021-02-11 DIAGNOSIS — I4819 Other persistent atrial fibrillation: Secondary | ICD-10-CM | POA: Diagnosis present

## 2021-02-11 DIAGNOSIS — Z79899 Other long term (current) drug therapy: Secondary | ICD-10-CM | POA: Diagnosis not present

## 2021-02-11 DIAGNOSIS — Z885 Allergy status to narcotic agent status: Secondary | ICD-10-CM | POA: Diagnosis not present

## 2021-02-11 DIAGNOSIS — K259 Gastric ulcer, unspecified as acute or chronic, without hemorrhage or perforation: Secondary | ICD-10-CM | POA: Diagnosis present

## 2021-02-11 DIAGNOSIS — Z6827 Body mass index (BMI) 27.0-27.9, adult: Secondary | ICD-10-CM | POA: Diagnosis not present

## 2021-02-11 DIAGNOSIS — T502X5A Adverse effect of carbonic-anhydrase inhibitors, benzothiadiazides and other diuretics, initial encounter: Secondary | ICD-10-CM | POA: Diagnosis present

## 2021-02-11 DIAGNOSIS — Z90710 Acquired absence of both cervix and uterus: Secondary | ICD-10-CM | POA: Diagnosis not present

## 2021-02-11 DIAGNOSIS — E663 Overweight: Secondary | ICD-10-CM | POA: Diagnosis present

## 2021-02-11 DIAGNOSIS — R17 Unspecified jaundice: Secondary | ICD-10-CM | POA: Diagnosis present

## 2021-02-11 DIAGNOSIS — K254 Chronic or unspecified gastric ulcer with hemorrhage: Secondary | ICD-10-CM

## 2021-02-11 HISTORY — DX: Other disorders of bilirubin metabolism: E80.6

## 2021-02-11 HISTORY — DX: Resistant hypertension: I1A.0

## 2021-02-11 HISTORY — DX: Acute kidney failure, unspecified: N17.9

## 2021-02-11 LAB — CBC WITH DIFFERENTIAL/PLATELET
Abs Immature Granulocytes: 0.03 10*3/uL (ref 0.00–0.07)
Basophils Absolute: 0 10*3/uL (ref 0.0–0.1)
Basophils Relative: 0 %
Eosinophils Absolute: 0.1 10*3/uL (ref 0.0–0.5)
Eosinophils Relative: 2 %
HCT: 41 % (ref 36.0–46.0)
Hemoglobin: 13.7 g/dL (ref 12.0–15.0)
Immature Granulocytes: 0 %
Lymphocytes Relative: 17 %
Lymphs Abs: 1.2 10*3/uL (ref 0.7–4.0)
MCH: 29.3 pg (ref 26.0–34.0)
MCHC: 33.4 g/dL (ref 30.0–36.0)
MCV: 87.6 fL (ref 80.0–100.0)
Monocytes Absolute: 0.4 10*3/uL (ref 0.1–1.0)
Monocytes Relative: 6 %
Neutro Abs: 4.9 10*3/uL (ref 1.7–7.7)
Neutrophils Relative %: 75 %
Platelets: 224 10*3/uL (ref 150–400)
RBC: 4.68 MIL/uL (ref 3.87–5.11)
RDW: 14.1 % (ref 11.5–15.5)
WBC: 6.7 10*3/uL (ref 4.0–10.5)
nRBC: 0 % (ref 0.0–0.2)

## 2021-02-11 LAB — BASIC METABOLIC PANEL
Anion gap: 10 (ref 5–15)
Anion gap: 7 (ref 5–15)
BUN: 11 mg/dL (ref 8–23)
BUN: 13 mg/dL (ref 8–23)
CO2: 21 mmol/L — ABNORMAL LOW (ref 22–32)
CO2: 25 mmol/L (ref 22–32)
Calcium: 9.4 mg/dL (ref 8.9–10.3)
Calcium: 9.5 mg/dL (ref 8.9–10.3)
Chloride: 93 mmol/L — ABNORMAL LOW (ref 98–111)
Chloride: 93 mmol/L — ABNORMAL LOW (ref 98–111)
Creatinine, Ser: 0.75 mg/dL (ref 0.44–1.00)
Creatinine, Ser: 1.34 mg/dL — ABNORMAL HIGH (ref 0.44–1.00)
GFR, Estimated: >60 mL/min (ref >60)
GFR, Estimated: 38 mL/min — ABNORMAL LOW (ref >60)
Glucose, Bld: 105 mg/dL — ABNORMAL HIGH (ref 70–99)
Glucose, Bld: 131 mg/dL — ABNORMAL HIGH (ref 70–99)
Potassium: 4.2 mmol/L (ref 3.5–5.1)
Potassium: 4.8 mmol/L (ref 3.5–5.1)
Sodium: 124 mmol/L — ABNORMAL LOW (ref 135–145)
Sodium: 125 mmol/L — ABNORMAL LOW (ref 135–145)

## 2021-02-11 LAB — ECHOCARDIOGRAM COMPLETE
Height: 63 in
S' Lateral: 3.9 cm
Weight: 2469.15 oz

## 2021-02-11 LAB — HEPATIC FUNCTION PANEL
ALT: 15 U/L (ref 0–44)
AST: 22 U/L (ref 15–41)
Albumin: 3.5 g/dL (ref 3.5–5.0)
Alkaline Phosphatase: 65 U/L (ref 38–126)
Bilirubin, Direct: 0.2 mg/dL (ref 0.0–0.2)
Indirect Bilirubin: 0.9 mg/dL (ref 0.3–0.9)
Total Bilirubin: 1.1 mg/dL (ref 0.3–1.2)
Total Protein: 6.2 g/dL — ABNORMAL LOW (ref 6.5–8.1)

## 2021-02-11 LAB — MRSA NEXT GEN BY PCR, NASAL: MRSA by PCR Next Gen: NOT DETECTED

## 2021-02-11 LAB — TSH: TSH: 1.195 u[IU]/mL (ref 0.350–4.500)

## 2021-02-11 MED ORDER — ASCORBIC ACID 500 MG PO TABS
1000.0000 mg | ORAL_TABLET | Freq: Every day | ORAL | Status: DC
  Administered 2021-02-11 – 2021-02-13 (×2): 1000 mg via ORAL
  Filled 2021-02-11 (×3): qty 2

## 2021-02-11 MED ORDER — HYDRALAZINE HCL 20 MG/ML IJ SOLN: 10.0000 mg | Freq: Four times a day (QID) | INTRAMUSCULAR | Status: DC | PRN

## 2021-02-11 MED ORDER — ALPRAZOLAM 0.25 MG PO TABS
0.1250 mg | ORAL_TABLET | Freq: Two times a day (BID) | ORAL | Status: DC | PRN
  Filled 2021-02-11: qty 1

## 2021-02-11 MED ORDER — CLONIDINE HCL 0.1 MG PO TABS
0.1000 mg | ORAL_TABLET | Freq: Every day | ORAL | Status: DC
  Administered 2021-02-11 – 2021-02-12 (×3): 0.1 mg via ORAL
  Filled 2021-02-11 (×3): qty 1

## 2021-02-11 MED ORDER — DILTIAZEM HCL 60 MG PO TABS
30.0000 mg | ORAL_TABLET | Freq: Three times a day (TID) | ORAL | Status: DC
  Administered 2021-02-11 – 2021-02-13 (×9): 30 mg via ORAL
  Filled 2021-02-11 (×12): qty 1

## 2021-02-11 MED ORDER — PANTOPRAZOLE SODIUM 40 MG PO TBEC
40.0000 mg | DELAYED_RELEASE_TABLET | Freq: Every day | ORAL | Status: DC
  Administered 2021-02-11 – 2021-02-13 (×3): 40 mg via ORAL
  Filled 2021-02-11 (×3): qty 1

## 2021-02-11 MED ORDER — SODIUM CHLORIDE 0.9 % IV SOLN: INTRAVENOUS | Status: AC

## 2021-02-11 MED ORDER — VITAMIN D 25 MCG (1000 UNIT) PO TABS
1000.0000 [IU] | ORAL_TABLET | Freq: Every day | ORAL | Status: DC
  Administered 2021-02-11 – 2021-02-13 (×2): 1000 [IU] via ORAL
  Filled 2021-02-11 (×3): qty 1

## 2021-02-11 MED ORDER — APIXABAN 5 MG PO TABS
5.0000 mg | ORAL_TABLET | Freq: Two times a day (BID) | ORAL | Status: DC
  Administered 2021-02-11 – 2021-02-12 (×5): 5 mg via ORAL
  Filled 2021-02-11 (×6): qty 1

## 2021-02-11 MED ORDER — BENAZEPRIL HCL 40 MG PO TABS
40.0000 mg | ORAL_TABLET | Freq: Every morning | ORAL | Status: DC
  Administered 2021-02-11 – 2021-02-13 (×3): 40 mg via ORAL
  Filled 2021-02-11 (×3): qty 1

## 2021-02-11 NOTE — ED Notes (Signed)
Tu DO notified to re-order expired fluids. Tu also asks RN to notified floor coverage if 0100 sodium level is still low with NS running.

## 2021-02-11 NOTE — Progress Notes (Signed)
°  Echocardiogram 2D Echocardiogram has been performed.  Monica French 02/11/2021, 3:26 PM

## 2021-02-11 NOTE — ED Notes (Addendum)
Dr. Myna Hidalgo updated on patient's 0100 sodium lab. NS rate to be maintained @ 70mL/hr.

## 2021-02-11 NOTE — Assessment & Plan Note (Addendum)
-  Continue her home PPI with pantoprazole 40 mg p.o. daily

## 2021-02-11 NOTE — Assessment & Plan Note (Addendum)
-   Patient's T bilirubin was 1.5 and improved to 1.1 with a indirect bilirubin of 0.9 and direct bilirubin of 0.2 yesterday: Repeat T Bili this AM is 0.2 -Continue IV fluid hydration and continue monitoring repeat CMP in a.m.

## 2021-02-11 NOTE — Assessment & Plan Note (Addendum)
-  Continue with alprazolam 0.125 mg - 0.25 mg p.o. twice daily as needed anxiety

## 2021-02-11 NOTE — Plan of Care (Signed)

## 2021-02-11 NOTE — Assessment & Plan Note (Addendum)
-   Blood pressures were ranging from normotensive to being elevated up to 180s at home despite being on benazepril, diltiazem as well as recently added triamterene-hydrochlorothiazide and clonidine -We will continue all her antihypertensives except the triamterene hydrochlorothiazide due to her acute hyponatremia -Initial creatinine was not elevated and UA was not suggestive of renovascular disease however creatinine is now elevated -We will continue IV fluid hydration and repeat echo and TSH done as below -Last echo in September 2022 showing EF of 50 to 55% with moderately elevated pulm artery systolic pressure with both atrium being moderately dilated and moderate mitral valve regurgitation as well as mild aortic valve regurgitation. -Blood pressures are improved and we have added IV hydralazine just in case she needs it as a as needed -Continue monitor blood pressures per protocol -BP was elevated yesterday AM to 154/71 and patient was feeling dizzy but is now improved and she is not dizzy as her blood pressure is 126/62 -Check Orthostatics because of Dizziness and ordered and she did not drop -Continue with home blood pressure medications but discontinue triamterene hydrochlorothiazide.  We will continue her benazepril, diltiazem, clonidine have her follow-up with her PCP and cardiologist in outpatient setting

## 2021-02-11 NOTE — Evaluation (Signed)
Physical Therapy Evaluation Patient Details Name: Monica French MRN: [id number] DOB: December 25, 1931 Today's Date: 02/11/2021  History of Present Illness  Patient is a 88 y/o female who presents on 02/10/21 with HTN, nausea, diarrhea and dizziness; admitted with hyponatremia and resistant HTN. Recent admission to [facility] last week on 02/02/21. PMH includes HTN and anxiety, persistent A-fib on Eliquis.  Clinical Impression  Patient lives alone and is independent with ADLs/IADLs and ambulation PTA. Today, pt tolerated transfers and gait training with supervision-Mod I. No dizziness or LOB noted with activity/ambulation.  Pre activity BP 138/68 Post activity BP 162/103 BP after being supine for 5 mins 121/60, asymptomatic throughout session.  Pt reports she feels close to functional baseline and family in room agrees. Recommend continued mobility while in the hospital as well as walking in hallways with nursing/mobility tech multiple times daily. Pt does not require further skilled therapy services. All education completed. Discharge from therapy.      Recommendations for follow up therapy are one component of a multi-disciplinary discharge planning process, led by the attending physician.  Recommendations may be updated based on patient status, additional functional criteria and insurance authorization.  Follow Up Recommendations No PT follow up    Assistance Recommended at Discharge PRN  Patient can return home with the following       Equipment Recommendations None recommended by PT  Recommendations for Other Services       Functional Status Assessment Patient has not had a recent decline in their functional status     Precautions / Restrictions Precautions Precautions: Other (comment) Precaution Comments: watch BP Restrictions Weight Bearing Restrictions: No      Mobility  Bed Mobility Overal bed mobility: Modified Independent             General bed mobility comments: No  assist needed.    Transfers Overall transfer level: Modified independent Equipment used: None               General transfer comment: Stood from EOB without difficulty, no dizziness.    Ambulation/Gait Ambulation/Gait assistance: Supervision, Modified independent (Device/Increase time) Gait Distance (Feet): 400 Feet Assistive device: None Gait Pattern/deviations: Step-through pattern, Decreased stride length, Drifts right/left   Gait velocity interpretation: 1.31 - 2.62 ft/sec, indicative of limited community ambulator   General Gait Details: Steady gait without difficulty, mild SOB but VSS on RA. Pt in A-fib with highest observed HR 123 bpm. Reports feeling at baseline. No dizziness.  Stairs            Wheelchair Mobility    Modified Rankin (Stroke Patients Only)       Balance Overall balance assessment: Mild deficits observed, not formally tested                                           Pertinent Vitals/Pain Pain Assessment Pain Assessment: No/denies pain    Home Living Family/patient expects to be discharged to:: Private residence Living Arrangements: Alone Available Help at Discharge: Family;Available PRN/intermittently Type of Home: House Home Access: Level entry       Home Layout: One level Home Equipment: Shower seat - built in;Rollator (4 wheels);Cane - single point      Prior Function Prior Level of Function : Independent/Modified Independent             Mobility Comments: Drives, cooks, cleans.  Hand Dominance   Dominant Hand: Right    Extremity/Trunk Assessment   Upper Extremity Assessment Upper Extremity Assessment: Defer to OT evaluation    Lower Extremity Assessment Lower Extremity Assessment: Overall WFL for tasks assessed       Communication   Communication: No difficulties  Cognition Arousal/Alertness: Awake/alert Behavior During Therapy: WFL for tasks assessed/performed Overall  Cognitive Status: Within Functional Limits for tasks assessed                                          General Comments General comments (skin integrity, edema, etc.): Son and granddaughter present. Pre activity BP 138/68, post activity BP 162/103, BP after being supine for 5 mins 121/60, asymptomatic throughout    Exercises     Assessment/Plan    PT Assessment Patient does not need any further PT services  PT Problem List         PT Treatment Interventions      PT Goals (Current goals can be found in the Care Plan section)  Acute Rehab PT Goals Patient Stated Goal: to feel better and go home PT Goal Formulation: All assessment and education complete, DC therapy    Frequency       Co-evaluation               AM-PAC PT "6 Clicks" Mobility  Outcome Measure Help needed turning from your back to your side while in a flat bed without using bedrails?: None Help needed moving from lying on your back to sitting on the side of a flat bed without using bedrails?: None Help needed moving to and from a bed to a chair (including a wheelchair)?: None Help needed standing up from a chair using your arms (e.g., wheelchair or bedside chair)?: A Little Help needed to walk in hospital room?: A Little Help needed climbing 3-5 steps with a railing? : A Little 6 Click Score: 21    End of Session   Activity Tolerance: Patient tolerated treatment well Patient left: in bed;with call bell/phone within reach;with family/visitor present Nurse Communication: Mobility status PT Visit Diagnosis: Other abnormalities of gait and mobility (R26.89)    Time: 6122-4497 PT Time Calculation (min) (ACUTE ONLY): 27 min   Charges:   PT Evaluation $PT Eval Moderate Complexity: 1 Mod PT Treatments $Gait Training: 8-22 mins        Marisa Severin, PT, DPT Acute Rehabilitation Services Pager [phone] Office [phone]     Marguarite Arbour A Sabra Heck 02/11/2021, 3:37 PM

## 2021-02-11 NOTE — Assessment & Plan Note (Addendum)
-  Has chronic atrial fibrillation has been having palpitations recently -Suspected that some of her symptoms could be due to atrial fibrillation with RVR however she is rate controlled -We will continue on telemetry -We will continue with anticoagulation with apixaban and rate control with diltiazem -Echocardiogram was checked and showed no acute changes from previous -TSH was done and showed a level of 1.195 -She felt dizzy yesterday AM so ? Rate related but HR was in the 80's to 90's and cardiology feels that this is less likely related to her A-fib; MRI of Brain ordered and unremarkable as above -May get Cardiology involved to evaluate however I spoke with Dr. Quentin Ore of EP who feels that her A-fib is relatively well controlled and recommends to continue to be on this regimen.

## 2021-02-11 NOTE — Progress Notes (Signed)
Progress Note   Patient: Monica French SHF:[id number] DOB: Mar 19, 1931 DOA: 02/10/2021     0 DOS: the patient was seen and examined on 02/11/2021   Brief hospital course: Patient is an elderly 88 year old Caucasian female with a past medical history significant for but not limited to persistent atrial fibrillation on anticoagulation with Eliquis, hypertension, history of gastric ulcer, anxiety, as well as hyperlipidemia who presents with concerns of dizziness as well as elevated blood pressure.  About a week ago she was noted to have acute dizziness and nausea with movement.  She presented to [facility] and was reportedly told everything was okay but then unfortunately there is no documentation in care everywhere.  She was noted to have fluctuating blood pressure range from systolic of 502D down to 741.  She normally takes benazepril and diltiazem chronically and blood pressures have been previously stable.  She saw her PCP because of her elevated blood pressures and he started on triamterene-hydrochlorothiazide 02/04/2021 in addition to clonidine 0.1 mg subsequently later when her blood pressure did not improve on 02/08/2021.  She reports continuing feeling weak and decreased appetite and felt nauseous but no vomiting.  Had 2 episodes of loose stools and diarrhea but had some intermittent bilateral facial numbness and had intermittent heart palpitations but no chest pain.  The symptoms have now resolved.  In the ED when she came in blood pressure ranging from systolic 287O to 676H and she remained in A-fib which was rate controlled.  She was noted to have a significant hyponatremia likely in the setting of her diarrhea and diuretic usage.  Her triamterene-hydrochlorothiazide has been held and discontinued and her sodium is slowly trending up with fluid resuscitation.  Given her symptoms this head CT was ordered and was negative.  She was also ordered for an echocardiogram which showed an EF of 60 to 65%  and the left ventricular diastolic function could not be evaluated due to her A-fib.  Right ventricular systolic function was normal and there is no significant change from her prior study.  PT OT evaluated and recommended no follow-up.  Given that her sodium was still on the lower side we will continue IV fluid hydration and monitor overnight and if is improved can likely be discharged home if her symptoms have improved and resolved.  Assessment and Plan: * Hyponatremia- (present on admission) - On admission she had a sodium of 122 and osmolality labs done and showed a serum osmolality of 254 -Suspected that her acute hyponatremia was in the setting of decreased appetite and being started on triamterene/hydrochlorothiazide as well as loose stools that she had -She started on normal saline at 75 cc/h we will continue given that her repeat sodium around midnight is 124; I have ordered a repeat sodium this afternoon and was improved 125 slightly. -We will continue IV fluid hydration at this time and repeat CMP in a.m.  Hyperbilirubinemia - Patient's T bilirubin was 1.5 and improved to 1.1 with a indirect bilirubin of 0.9 and direct bilirubin of 0.2 -Continue IV fluid hydration and continue monitoring repeat CMP in a.m.  AKI (acute kidney injury) ([place]) - Unclear etiology but could be hemodynamic mediated given fluctuations in blood pressure in addition of the hydrochlorothiazide and triamterene -May consider holding her benazepril 41 p.o. daily given her worsening renal function -We will hold her nephrotoxic medications, and avoid contrast dyes, hypotension and renally dose medications -Patient BUN/creatinine slowly trended up and went from 14/0.86 -> 11/0.75 -> 13/1.34 -Continue with  fluid hydration with normal saline at 75 mils per hour -Repeat CMP in the a.m. medically monitor trend renal function if worsening may consider neurology consult and further work-up with a renal  ultrasound   Resistant hypertension - Blood pressures were ranging from normotensive to being elevated up to 180s at home despite being on benazepril, diltiazem as well as recently added triamterene hydrochlorothiazide and clonidine -We will continue all her antihypertensives except the triamterene hydrochlorothiazide due to her acute hyponatremia -Initial creatinine was not elevated and UA was not suggestive of renovascular disease however creatinine is now elevated -We will continue IV fluid hydration and repeat echo and TSH done as below -Last echo in September 2022 showing EF of 50 to 55% with moderately elevated pulm artery systolic pressure with both atrium being moderately dilated and moderate mitral valve regurgitation as well as mild aortic valve regurgitation. -Blood pressures are improved and we have added IV hydralazine just in case she needs it as a as needed -Continue monitor blood pressures per protocol -Last blood pressure reading was 115/65   Gastric ulcer- (present on admission) - Continue her home PPI with pantoprazole 40 mg p.o. daily  Anxiety- (present on admission) - Continue with alprazolam 0.125 mg - 0.25 mg p.o. twice daily as needed anxiety  Persistent atrial fibrillation ([place])- (present on admission) -Has chronic atrial fibrillation has been having palpitations recently -Suspected that some of her symptoms could be due to atrial fibrillation with RVR however she is rate controlled -We will continue on telemetry -We will continue with anticoagulation with apixaban and rate control with diltiazem -Echocardiogram was checked and showed no acute changes from previous -TSH was done and showed a level of 1.195   Subjective: Seen and examined at bedside and states that her symptoms in her jaw have improved.  States that she is not as dizzy.  No lightheadedness currently.  Feels okay.  No other concerns or complaints at this time.  Physical Exam: Vitals:   02/11/21  1230 02/11/21 1245 02/11/21 1255 02/11/21 1630  BP: 105/60 (!) 123/57  115/65  Pulse: (!) 154 73  77  Resp: 18 (!) 21  20  Temp:   98.2 F (36.8 C) 98.7 F (37.1 C)  TempSrc:   Oral Oral  SpO2: 97% 99%  98%  Weight:      Height:       Examination: Physical Exam:  Constitutional: Overweight elderly Caucasian female currently no acute distress appears calm  Respiratory: Diminished to auscultation bilaterally, no wheezing, rales, rhonchi or crackles. Normal respiratory effort and patient is not tachypenic. No accessory muscle use.  Unlabored breathing Cardiovascular: Irregularly irregular but rate controlled, no murmurs / rubs / gallops. S1 and S2 auscultated.  Minimal extremity edema Abdomen: Soft, non-tender, slightly distended. Bowel sounds positive.  GU: Deferred. Neurologic: CN 2-12 grossly intact with no focal deficits. Romberg sign cerebellar reflexes not assessed.   Data Reviewed:  I have independently reviewed and interpreted with patient CMP, CBC as well as her urinalysis  Results show that her T. bili has improved and normalized.  Her sodium is slowly trending up, from 122 on admission is now 125.  She now has an AKI with a BUN/creatinine of 25/1.31  Family Communication: Discussed with Son at bedside   Disposition: Status is: Inpatient Remains inpatient appropriate because: Sodium still on the lower side will need to be improved and now she has an AKI   Planned Discharge Destination: Home  DVT Prophylaxis: C/w Apixaban  Author: Raiford Noble, DO [facility] 02/11/2021 5:26 PM  For on call review [email or web address].

## 2021-02-11 NOTE — Assessment & Plan Note (Addendum)
-  On admission she had a sodium of 122 and osmolality labs done and showed a serum osmolality of 254 -Suspected that her acute hyponatremia was in the setting of decreased appetite and being started on triamterene/hydrochlorothiazide as well as loose stools that she had -She started on normal saline at 75 cc/h we will continue given that her repeat sodium this AM is 133 -Given a 500 mL Bolus yesterday AM and will repeat CMP in outpatient setting as she is improved

## 2021-02-11 NOTE — Hospital Course (Addendum)
Patient is an elderly 88 year old Caucasian female with a past medical history significant for but not limited to persistent atrial fibrillation on anticoagulation with Eliquis, hypertension, history of gastric ulcer, anxiety, as well as hyperlipidemia who presents with concerns of dizziness as well as elevated blood pressure.  About a week ago she was noted to have acute dizziness and nausea with movement.  She presented to [facility] and was reportedly told everything was okay but then unfortunately there is no documentation in care everywhere.  She was noted to have fluctuating blood pressure range from systolic of 591M down to 384.  She normally takes benazepril and diltiazem chronically and blood pressures have been previously stable.  She saw her PCP because of her elevated blood pressures and he started on triamterene-hydrochlorothiazide 02/04/2021 in addition to clonidine 0.1 mg subsequently later when her blood pressure did not improve on 02/08/2021.  She reports continuing feeling weak and decreased appetite and felt nauseous but no vomiting.  Had 2 episodes of loose stools and diarrhea but had some intermittent bilateral facial numbness and had intermittent heart palpitations but no chest pain.  The symptoms have now resolved.  In the ED when she came in blood pressure ranging from systolic 665L to 935T and she remained in A-fib which was rate controlled.  She was noted to have a significant hyponatremia likely in the setting of her diarrhea and diuretic usage.  Her triamterene-hydrochlorothiazide has been held and discontinued and her sodium is slowly trending up with fluid resuscitation.  Given her symptoms this head CT was ordered and was negative.  She was also ordered for an echocardiogram which showed an EF of 60 to 65% and the left ventricular diastolic function could not be evaluated due to her A-fib.  Right ventricular systolic function was normal and there is no significant change from her  prior study.  PT OT evaluated and recommended no follow-up.    Given that her sodium was still on the lower side we will continue IV fluid hydration and monitor overnight and if is improved can likely be discharged home if her symptoms have improved and resolved however this AM she had Nausea and Dizziness. Na+ is now 128 so will continue IVF and obtain MRI. PT/OT recommending Home Health  MRI done and was relatively unremarkable.  Case was discussed with the neurologist Dr. Leonel Ramsay who felt that the patient's sodium was likely contributing to her dizziness and her dizziness was much improved today than yesterday.  She is no longer nauseous.  Sodium has trended up to 133 and the case is also discussed with the EP cardiologist Dr. Quentin Ore who felt that the A-fib was not the cause of the patient's dizziness.  Patient underwent vestibular testing this was negative.  She had orthostatic vital signs done and she did not drop her orthostatic vital signs.  She ambulated quite well and she will be discharged with home health PT OT.  Patient has been a little confused during her hospitalization and patient family has recently noticed that she is becoming more more forgetful.  We suspect that she may have some dementia and so we will make a referral to outpatient neurology [facility] prior to discharge.  Patient can also follow-up for further dizziness work-up if she continues to have dizziness as an outpatient.  Blood pressure at the time of discharge was stable and she was deemed stable for discharge home at this time.

## 2021-02-11 NOTE — Assessment & Plan Note (Addendum)
-   Unclear etiology but could be hemodynamic mediated given fluctuations in blood pressure in addition of the hydrochlorothiazide and triamterene -May consider holding her benazepril 41 p.o. daily given her worsening renal function -We will hold her nephrotoxic medications, and avoid contrast dyes, hypotension and renally dose medications -Patient BUN/creatinine slowly trended up and went from 14/0.86 -> 11/0.75 -> 13/1.34 -> 16/1.00 and today her BUN/creatinine is 12/0.70 -Continue with fluid hydration with normal saline at 75 mils per hour for 1 day and stopped this morning but removed prior to discharge until the time of discharge -Repeat CMP within 1 week

## 2021-02-12 ENCOUNTER — Inpatient Hospital Stay (HOSPITAL_COMMUNITY): Payer: Medicare Other

## 2021-02-12 DIAGNOSIS — R42 Dizziness and giddiness: Secondary | ICD-10-CM

## 2021-02-12 HISTORY — DX: Dizziness and giddiness: R42

## 2021-02-12 LAB — CBC WITH DIFFERENTIAL/PLATELET
Abs Immature Granulocytes: 0.04 10*3/uL (ref 0.00–0.07)
Basophils Absolute: 0 10*3/uL (ref 0.0–0.1)
Basophils Relative: 0 %
Eosinophils Absolute: 0.2 10*3/uL (ref 0.0–0.5)
Eosinophils Relative: 2 %
HCT: 38.2 % (ref 36.0–46.0)
Hemoglobin: 13.1 g/dL (ref 12.0–15.0)
Immature Granulocytes: 1 %
Lymphocytes Relative: 27 %
Lymphs Abs: 1.9 10*3/uL (ref 0.7–4.0)
MCH: 29.7 pg (ref 26.0–34.0)
MCHC: 34.3 g/dL (ref 30.0–36.0)
MCV: 86.6 fL (ref 80.0–100.0)
Monocytes Absolute: 0.5 10*3/uL (ref 0.1–1.0)
Monocytes Relative: 7 %
Neutro Abs: 4.5 10*3/uL (ref 1.7–7.7)
Neutrophils Relative %: 63 %
Platelets: 196 10*3/uL (ref 150–400)
RBC: 4.41 MIL/uL (ref 3.87–5.11)
RDW: 14 % (ref 11.5–15.5)
WBC: 7.1 10*3/uL (ref 4.0–10.5)
nRBC: 0 % (ref 0.0–0.2)

## 2021-02-12 LAB — COMPREHENSIVE METABOLIC PANEL
ALT: 13 U/L (ref 0–44)
AST: 20 U/L (ref 15–41)
Albumin: 3.2 g/dL — ABNORMAL LOW (ref 3.5–5.0)
Alkaline Phosphatase: 62 U/L (ref 38–126)
Anion gap: 8 (ref 5–15)
BUN: 16 mg/dL (ref 8–23)
CO2: 25 mmol/L (ref 22–32)
Calcium: 9.4 mg/dL (ref 8.9–10.3)
Chloride: 95 mmol/L — ABNORMAL LOW (ref 98–111)
Creatinine, Ser: 1 mg/dL (ref 0.44–1.00)
GFR, Estimated: 55 mL/min — ABNORMAL LOW (ref >60)
Glucose, Bld: 118 mg/dL — ABNORMAL HIGH (ref 70–99)
Potassium: 4.5 mmol/L (ref 3.5–5.1)
Sodium: 128 mmol/L — ABNORMAL LOW (ref 135–145)
Total Bilirubin: 1 mg/dL (ref 0.3–1.2)
Total Protein: 5.6 g/dL — ABNORMAL LOW (ref 6.5–8.1)

## 2021-02-12 LAB — MAGNESIUM: Magnesium: 1.8 mg/dL (ref 1.7–2.4)

## 2021-02-12 LAB — PHOSPHORUS: Phosphorus: 3.1 mg/dL (ref 2.5–4.6)

## 2021-02-12 MED ORDER — ONDANSETRON HCL 4 MG/2ML IJ SOLN
4.0000 mg | Freq: Four times a day (QID) | INTRAMUSCULAR | Status: DC | PRN
  Administered 2021-02-12: 4 mg via INTRAVENOUS
  Filled 2021-02-12: qty 2

## 2021-02-12 MED ORDER — SODIUM CHLORIDE 0.9 % IV SOLN
INTRAVENOUS | Status: DC
Start: 2021-02-12 — End: 2021-02-12

## 2021-02-12 MED ORDER — SODIUM CHLORIDE 0.9 % IV BOLUS
500.0000 mL | Freq: Once | INTRAVENOUS | Status: AC
Start: 2021-02-12 — End: 2021-02-12
  Administered 2021-02-12: 500 mL via INTRAVENOUS

## 2021-02-12 MED ORDER — SODIUM CHLORIDE 0.9 % IV SOLN: INTRAVENOUS | Status: AC

## 2021-02-12 MED ORDER — MAGNESIUM SULFATE 2 GM/50ML IV SOLN
2.0000 g | Freq: Once | INTRAVENOUS | Status: AC
  Administered 2021-02-12: 2 g via INTRAVENOUS
  Filled 2021-02-12: qty 50

## 2021-02-12 NOTE — Progress Notes (Signed)
Pt c/o feeling dizzy.  BP 154/71, HR 80-90's SR.  Lab Na 128.  Dr. Myna Hidalgo text paged, waiting return call.

## 2021-02-12 NOTE — Assessment & Plan Note (Addendum)
-  Unclear etiology but has had nausea as well -Start Antiemetics -Given that the dizziness was at rest will obtain MRI and MRI done and showed "Age congruent senescent changes.  No acute or reversible finding." -PT/OT to evaluate and will need Vestibular Evaluation -Order TED Hose and given a 500 mL NS bolus yesterday -Repeat Orthostatic Vital Signs showed that she did not drop -Vestibular testing done and this was negative; PT recommending outpatient vestibular testing for further additional testing -Was no longer dizzy at the time of discharge and will need to follow-up with her PCP, cardiologist as well as neurologist in outpatient setting and referral has been made to general neurology for the dizziness in case she continues to have

## 2021-02-12 NOTE — Progress Notes (Signed)
Progress Note   Patient: Monica French HYQ:[id number] DOB: Jun 12, 1931 DOA: 02/10/2021     1 DOS: the patient was seen and examined on 02/12/2021   Brief hospital course: Patient is an elderly 88 year old Caucasian female with a past medical history significant for but not limited to persistent atrial fibrillation on anticoagulation with Eliquis, hypertension, history of gastric ulcer, anxiety, as well as hyperlipidemia who presents with concerns of dizziness as well as elevated blood pressure.  About a week ago she was noted to have acute dizziness and nausea with movement.  She presented to [facility] and was reportedly told everything was okay but then unfortunately there is no documentation in care everywhere.  She was noted to have fluctuating blood pressure range from systolic of 952W down to 413.  She normally takes benazepril and diltiazem chronically and blood pressures have been previously stable.  She saw her PCP because of her elevated blood pressures and he started on triamterene-hydrochlorothiazide 02/04/2021 in addition to clonidine 0.1 mg subsequently later when her blood pressure did not improve on 02/08/2021.  She reports continuing feeling weak and decreased appetite and felt nauseous but no vomiting.  Had 2 episodes of loose stools and diarrhea but had some intermittent bilateral facial numbness and had intermittent heart palpitations but no chest pain.  The symptoms have now resolved.  In the ED when she came in blood pressure ranging from systolic 244W to 102V and she remained in A-fib which was rate controlled.  She was noted to have a significant hyponatremia likely in the setting of her diarrhea and diuretic usage.  Her triamterene-hydrochlorothiazide has been held and discontinued and her sodium is slowly trending up with fluid resuscitation.  Given her symptoms this head CT was ordered and was negative.  She was also ordered for an echocardiogram which showed an EF of 60 to 65%  and the left ventricular diastolic function could not be evaluated due to her A-fib.  Right ventricular systolic function was normal and there is no significant change from her prior study.  PT OT evaluated and recommended no follow-up.    Given that her sodium was still on the lower side we will continue IV fluid hydration and monitor overnight and if is improved can likely be discharged home if her symptoms have improved and resolved however this AM she had Nausea and Dizziness. Na+ is now 128 so will continue IVF and obtain MRI. PT/OT recommending Home Health  Assessment and Plan: * Hyponatremia- (present on admission) -On admission she had a sodium of 122 and osmolality labs done and showed a serum osmolality of 254 -Suspected that her acute hyponatremia was in the setting of decreased appetite and being started on triamterene/hydrochlorothiazide as well as loose stools that she had -She started on normal saline at 75 cc/h we will continue given that her repeat sodium this AM is 128 -Given a 500 mL Bolus this AM and will repeat CMP in a.m.  Dizziness -Unclear etiology but has had nausea as well -Start Antiemetics -Given that the dizziness was at rest will obtain MRI and MRI done and showed "Age congruent senescent changes.  No acute or reversible finding." -PT/OT to evaluate and will need Vestibular Evaluation -Order TED Hose and given a 500 mL NS bolus -Repeat Orthostatic Vital Signs  Hyperbilirubinemia - Patient's T bilirubin was 1.5 and improved to 1.1 with a indirect bilirubin of 0.9 and direct bilirubin of 0.2 yesterday: Repeat T Bili this AM is 1.0 -Continue IV fluid  hydration and continue monitoring repeat CMP in a.m.  AKI (acute kidney injury) ([place]) - Unclear etiology but could be hemodynamic mediated given fluctuations in blood pressure in addition of the hydrochlorothiazide and triamterene -May consider holding her benazepril 41 p.o. daily given her worsening renal  function -We will hold her nephrotoxic medications, and avoid contrast dyes, hypotension and renally dose medications -Patient BUN/creatinine slowly trended up and went from 14/0.86 -> 11/0.75 -> 13/1.34 -> 16/1.00 -Continue with fluid hydration with normal saline at 75 mils per hour for 1 day  -Repeat CMP in the a.m. medically monitor trend renal function if worsening may consider neurology consult and further work-up with a renal ultrasound   Resistant hypertension - Blood pressures were ranging from normotensive to being elevated up to 180s at home despite being on benazepril, diltiazem as well as recently added triamterene-hydrochlorothiazide and clonidine -We will continue all her antihypertensives except the triamterene hydrochlorothiazide due to her acute hyponatremia -Initial creatinine was not elevated and UA was not suggestive of renovascular disease however creatinine is now elevated -We will continue IV fluid hydration and repeat echo and TSH done as below -Last echo in September 2022 showing EF of 50 to 55% with moderately elevated pulm artery systolic pressure with both atrium being moderately dilated and moderate mitral valve regurgitation as well as mild aortic valve regurgitation. -Blood pressures are improved and we have added IV hydralazine just in case she needs it as a as needed -Continue monitor blood pressures per protocol -BP was elevated this AM to 154/71 and patient was feeling dizzy but is now 132/69 -Check Orthostatics because of Dizziness and ordered and pending    Gastric ulcer- (present on admission) -Continue her home PPI with pantoprazole 40 mg p.o. daily  Anxiety- (present on admission) -Continue with alprazolam 0.125 mg - 0.25 mg p.o. twice daily as needed anxiety  Persistent atrial fibrillation ([place])- (present on admission) -Has chronic atrial fibrillation has been having palpitations recently -Suspected that some of her symptoms could be due to atrial  fibrillation with RVR however she is rate controlled -We will continue on telemetry -We will continue with anticoagulation with apixaban and rate control with diltiazem -Echocardiogram was checked and showed no acute changes from previous -TSH was done and showed a level of 1.195 -She felt dizzy this AM so ? Rate related but HR was in the 80's to 90's; MRI of Brain ordered -May get Cardiology involved to evaluate   Subjective: Examined at bedside and states that she had a rough morning and was dizzy and nauseous this morning.  States that she did not actually vomit but felt dizzy just laying in the bed.  States has been happening frequently for the last week or so.  No chest pain or shortness of breath.  Denies any other concerns or complaints at this time  Physical Exam: Vitals:   02/12/21 1310 02/12/21 1311 02/12/21 1312 02/12/21 1316  BP: 129/72 133/67 (!) 143/60 (!) 146/69  Pulse: (!) 103   99  Resp:    15  Temp:    97.7 F (36.5 C)  TempSrc:    Oral  SpO2:    97%  Weight:      Height:       Examination: Physical Exam:  Constitutional: WN/WD, NAD and appears calm and comfortable Respiratory: Diminished to auscultation bilaterally, no wheezing, rales, rhonchi or crackles. Normal respiratory effort and patient is not tachypenic. No accessory muscle use. Unlabored breathing  Cardiovascular: Irregularly Irregular, no murmurs /  rubs / gallops. S1 and S2 auscultated.  Abdomen: Soft, non-tender, slightly-distended. No masses palpated. No appreciable hepatosplenomegaly. Bowel sounds positive.  GU: Deferred.  Data Reviewed:  I have independently reviewed and interpreted the patient's CMP and CBC  Patient's sodium now 128, and her glucose is 118.  Family Communication: No family currently at bedside  Disposition: Status is: Inpatient Remains inpatient appropriate because: We will need to work-up the patient's dizziness and continue IV fluid hydration for her low sodium    Planned Discharge Destination: Home with Home Health  DVT Prophylaxis: Anticoagulated with Apixaban   Author: Raiford Noble, DO [facility] 02/12/2021 2:43 PM  For on call review [email or web address].

## 2021-02-13 DIAGNOSIS — R42 Dizziness and giddiness: Secondary | ICD-10-CM

## 2021-02-13 DIAGNOSIS — F0390 Unspecified dementia without behavioral disturbance: Secondary | ICD-10-CM

## 2021-02-13 HISTORY — DX: Unspecified dementia, unspecified severity, without behavioral disturbance, psychotic disturbance, mood disturbance, and anxiety: F03.90

## 2021-02-13 LAB — PHOSPHORUS: Phosphorus: 2.6 mg/dL (ref 2.5–4.6)

## 2021-02-13 LAB — CBC WITH DIFFERENTIAL/PLATELET
Abs Immature Granulocytes: 0.02 10*3/uL (ref 0.00–0.07)
Basophils Absolute: 0 10*3/uL (ref 0.0–0.1)
Basophils Relative: 0 %
Eosinophils Absolute: 0.1 10*3/uL (ref 0.0–0.5)
Eosinophils Relative: 2 %
HCT: 36.3 % (ref 36.0–46.0)
Hemoglobin: 12.2 g/dL (ref 12.0–15.0)
Immature Granulocytes: 0 %
Lymphocytes Relative: 20 %
Lymphs Abs: 1.1 10*3/uL (ref 0.7–4.0)
MCH: 29.5 pg (ref 26.0–34.0)
MCHC: 33.6 g/dL (ref 30.0–36.0)
MCV: 87.7 fL (ref 80.0–100.0)
Monocytes Absolute: 0.4 10*3/uL (ref 0.1–1.0)
Monocytes Relative: 8 %
Neutro Abs: 3.8 10*3/uL (ref 1.7–7.7)
Neutrophils Relative %: 70 %
Platelets: 175 10*3/uL (ref 150–400)
RBC: 4.14 MIL/uL (ref 3.87–5.11)
RDW: 14.4 % (ref 11.5–15.5)
WBC: 5.5 10*3/uL (ref 4.0–10.5)
nRBC: 0 % (ref 0.0–0.2)

## 2021-02-13 LAB — COMPREHENSIVE METABOLIC PANEL
ALT: 14 U/L (ref 0–44)
AST: 15 U/L (ref 15–41)
Albumin: 2.9 g/dL — ABNORMAL LOW (ref 3.5–5.0)
Alkaline Phosphatase: 54 U/L (ref 38–126)
Anion gap: 7 (ref 5–15)
BUN: 12 mg/dL (ref 8–23)
CO2: 21 mmol/L — ABNORMAL LOW (ref 22–32)
Calcium: 8.9 mg/dL (ref 8.9–10.3)
Chloride: 105 mmol/L (ref 98–111)
Creatinine, Ser: 0.78 mg/dL (ref 0.44–1.00)
GFR, Estimated: >60 mL/min (ref >60)
Glucose, Bld: 118 mg/dL — ABNORMAL HIGH (ref 70–99)
Potassium: 4.1 mmol/L (ref 3.5–5.1)
Sodium: 133 mmol/L — ABNORMAL LOW (ref 135–145)
Total Bilirubin: 0.2 mg/dL — ABNORMAL LOW (ref 0.3–1.2)
Total Protein: 5.1 g/dL — ABNORMAL LOW (ref 6.5–8.1)

## 2021-02-13 LAB — AMMONIA: Ammonia: 16 umol/L (ref 9–35)

## 2021-02-13 LAB — MAGNESIUM: Magnesium: 2 mg/dL (ref 1.7–2.4)

## 2021-02-13 MED ORDER — SODIUM CHLORIDE 0.9 % IV SOLN: INTRAVENOUS | Status: DC

## 2021-02-13 NOTE — Evaluation (Signed)
Occupational Therapy Evaluation Patient Details Name: Monica French MRN: [id number] DOB: November 07, 1931 Today's Date: 02/13/2021   History of Present Illness Patient is a 88 y/o female who presents on 02/10/21 with HTN, nausea, diarrhea and dizziness; admitted with hyponatremia and resistant HTN. Recent admission to [place] last week on 02/02/21. PMH includes HTN and anxiety, persistent A-fib on Eliquis.   Clinical Impression   PTA patient independent and driving. Admitted for above and presenting with problem list below, including impaired cognition, weakness, and decreased activity tolerance. Short blessed test reveals deficits in short term memory, attention and sequencing- scoring 10/28 significant impairments and recommend further testing using pill box test.  Completes ADLs with min guard to supervision.  Noted BP assessed with decrease in SBP from sitting to standing, but recovers after sustained standing- pt very inconsistent with reports of dizziness and often reaches out for UE support when dizziness occurs during in room mobility. Based on performance today, believe pt will benefit from further OT services acutely and after dc at [facility] level, as long as she has 24/7 support.  Due to cognitive piece, recommend assist with medications, cooking, and driving at this time.      Recommendations for follow up therapy are one component of a multi-disciplinary discharge planning process, led by the attending physician.  Recommendations may be updated based on patient status, additional functional criteria and insurance authorization.   Follow Up Recommendations  Home health OT    Assistance Recommended at Discharge Frequent or constant Supervision/Assistance  Patient can return home with the following A little help with walking and/or transfers;A little help with bathing/dressing/bathroom;Assistance with cooking/housework;Direct supervision/assist for medications management;Direct supervision/assist  for financial management    Functional Status Assessment  Patient has had a recent decline in their functional status and demonstrates the ability to make significant improvements in function in a reasonable and predictable amount of time.  Equipment Recommendations  BSC/3in1    Recommendations for Other Services       Precautions / Restrictions Precautions Precautions: Other (comment) Precaution Comments: watch BP Restrictions Weight Bearing Restrictions: No      Mobility Bed Mobility Overal bed mobility: Modified Independent             General bed mobility comments: No assist needed.    Transfers                          Balance Overall balance assessment: Mild deficits observed, not formally tested                                         ADL either performed or assessed with clinical judgement   ADL Overall ADL's : Needs assistance/impaired     Grooming: Supervision/safety;Standing           Upper Body Dressing : Set up;Sitting   Lower Body Dressing: Min guard;Sit to/from stand Lower Body Dressing Details (indicate cue type and reason): mild unsteadiness at times, reaching for UE support Toilet Transfer: Min guard;Ambulation Toilet Transfer Details (indicate cue type and reason): intermittent hand held assist Toileting- Clothing Manipulation and Hygiene: Min guard;Sit to/from stand       Functional mobility during ADLs: Min guard General ADL Comments: hand held assist at times due to mild unsteadiness.     Vision   Vision Assessment?: No apparent visual deficits     Perception  Praxis      Pertinent Vitals/Pain Pain Assessment Pain Assessment: No/denies pain     Hand Dominance Right   Extremity/Trunk Assessment Upper Extremity Assessment Upper Extremity Assessment: Generalized weakness   Lower Extremity Assessment Lower Extremity Assessment: Defer to PT evaluation       Communication  Communication Communication: No difficulties   Cognition Arousal/Alertness: Awake/alert Behavior During Therapy: WFL for tasks assessed/performed Overall Cognitive Status: Impaired/Different from baseline Area of Impairment: Memory, Attention, Awareness, Problem solving                   Current Attention Level: Sustained Memory: Decreased short-term memory     Awareness: Emergent Problem Solving: Requires verbal cues, Slow processing General Comments: pt scored 10/28 on short blessed test with deficits seen in attention, sequencing and memory.  She reports "feeling off" but has a hard time describing deficits.  She requires intermittent cueing for recall, safety and problem sovling. She is distracted by lines, and requires redirection throughout session.     General Comments  BP assessed throughout session- noted SBP decreased from 170s  to 110s, from EOB to standing then recovered with standing x3 minutes. Pt very inconsistent with reports of dizziness.    Exercises     Shoulder Instructions      Home Living Family/patient expects to be discharged to:: Private residence Living Arrangements: Alone Available Help at Discharge: Family;Available PRN/intermittently Type of Home: House Home Access: Level entry     Home Layout: One level     Bathroom Shower/Tub: Occupational psychologist: Handicapped height     Home Equipment: Shower seat - built in;Rollator (4 wheels);Cane - single point;Grab bars - tub/shower   Additional Comments: reports son lives nearby      Prior Functioning/Environment Prior Level of Function : Independent/Modified Independent               ADLs Comments: very independnet- driving, cooking, cleaning        OT Problem List: Decreased strength;Decreased activity tolerance;Decreased cognition;Decreased safety awareness;Decreased knowledge of use of DME or AE;Decreased knowledge of precautions      OT Treatment/Interventions:  Self-care/ADL training;Energy conservation;DME and/or AE instruction;Therapeutic activities;Cognitive remediation/compensation;Patient/family education;Balance training    OT Goals(Current goals can be found in the care plan section) Acute Rehab OT Goals Patient Stated Goal: home OT Goal Formulation: With patient Time For Goal Achievement: 02/27/21 Potential to Achieve Goals: Good  OT Frequency: Min 2X/week    Co-evaluation              AM-PAC OT "6 Clicks" Daily Activity     Outcome Measure Help from another person eating meals?: None Help from another person taking care of personal grooming?: A Little Help from another person toileting, which includes using toliet, bedpan, or urinal?: A Little Help from another person bathing (including washing, rinsing, drying)?: A Little Help from another person to put on and taking off regular upper body clothing?: A Little Help from another person to put on and taking off regular lower body clothing?: A Little 6 Click Score: 19   End of Session Nurse Communication: Mobility status  Activity Tolerance: Patient tolerated treatment well Patient left: with call bell/phone within reach;with bed alarm set;Other (comment) (seated EOB)  OT Visit Diagnosis: Other abnormalities of gait and mobility (R26.89);Muscle weakness (generalized) (M62.81);Other symptoms and signs involving cognitive function                Time: 5397-6734 OT Time Calculation (min): 28  min Charges:  OT General Charges $OT Visit: 1 Visit OT Evaluation $OT Eval Moderate Complexity: 1 Mod OT Treatments $Self Care/Home Management : 8-22 mins  Monica French, OT Acute Rehabilitation Services Pager [phone] Office [phone]   Monica French 02/13/2021, 10:56 AM

## 2021-02-13 NOTE — TOC Transition Note (Signed)
Transition of Care (TOC) - CM/SW Discharge Note [name] RN, BSN Transitions of Care Unit 4E- RN Case Manager See Treatment Team for direct phone #  Weekend cross coverage  Patient Details  Name: Monica French MRN: [id number] Date of Birth: Aug 01, 1931  Transition of Care [facility]) CM/SW Contact:  [name], RN Phone Number: 02/13/2021, 4:52 PM   Clinical Narrative:    Notified by bedside RN - pt stable for transition home, orders placed for HHRN/PT/OT, CM came down to speak with pt and family (grandson) at the bedside.  Discussed HH with pt, pt voiced she is agreeable to try [facility] services. List provided for [facility] choice, Pt's first choice is [facility] (request therapist- Dillon Bjork if available, and female in general) Alvis Lemmings is selected as backup. Pt voiced that if neither of these agencies can accept then she has no further preference and will defer to this writer to try and secure an agency on her behalf starting with the higher star rated ones.   Address, phone # and PCP all confirmed with pt in epic. Family to transport home today. No DME needs noted at this time.   Call made to [facility]- spoke with RN -Santiago Glad -weekend on call- requested info be faxed to [phone] for them to review in the am. Info has been faxed, this writer will f/u in the am. And update pt when [facility] has been confirmed.    Final next level of care: [place] Barriers to Discharge: No Barriers Identified   Patient Goals and CMS Choice Patient states their goals for this hospitalization and ongoing recovery are:: to return home CMS Medicare.gov Compare Post Acute Care list provided to:: Patient Choice offered to / list presented to : Patient  Discharge Placement               Home w/ [facility]        Discharge Plan and Services   Discharge Planning Services: CM Consult Post Acute Care Choice: Home Health          DME Arranged: N/A DME Agency: NA        HH Arranged: RN, PT, OT [place] Agency: [place] Date [place]: 02/13/21 Time [place]: 1652 Representative spoke with at [place]: [place]) Interventions     Readmission Risk Interventions Readmission Risk Prevention Plan 02/13/2021  Post Dischage Appt Complete  Medication Screening Complete  Transportation Screening Complete  Some recent data might be hidden

## 2021-02-13 NOTE — Progress Notes (Signed)
Per mar pt took scheduled Cardizem at 0607 this morning and is scheduled for eliquis at 1000.  Pt is refusing to take eliquis this morning claiming she took it already with the previous shift. She states she took a pink oval pill. RN showed pt Cardizem pill next to eliquis and she states she took the eliquis.  Provider notified she is refusing morning dose.

## 2021-02-13 NOTE — Plan of Care (Signed)

## 2021-02-13 NOTE — Evaluation (Signed)
Physical Therapy Vestibular Evaluation Patient Details Name: Monica French MRN: [id number] DOB: Jan 10, 1931 Today's Date: 02/13/2021  History of Present Illness  Patient is a 88 y/o female who presents on 02/10/21 with HTN, nausea, diarrhea and dizziness; admitted with hyponatremia and resistant HTN. Recent admission to [facility] last week on 02/02/21. PMH includes HTN and anxiety, persistent A-fib on Eliquis.   Clinical Impression  Patient evaluated by Physical Therapy with no further acute PT needs identified. All education has been completed and the patient has no further questions.  PT was reconsulted for a vestibular assessment. Negative Dix-Hallpike bilaterally, and negative horizontal canal bilaterally with testing. No obvious nystagmus throughout session, however possibly some slight upbeating noted with R posterior canal test. If so, it resolved very quickly and was not visualized again. Pt does not endorse vertigo or spinning. Reports her symptoms feel like lightheadedness and pressure under her eyes/tops of cheeks and have been present for ~2 weeks. Pt reports feeling lightheaded at rest. Noted R eye sluggish and with saccades with tracking activity. If symptoms persist, may want to consider outpatient PT for more in depth vestibular testing, however do not feel pt has BPPV at this time. See below for any follow-up Physical Therapy or equipment needs. PT is signing off. Thank you for this referral.      Recommendations for follow up therapy are one component of a multi-disciplinary discharge planning process, led by the attending physician.  Recommendations may be updated based on patient status, additional functional criteria and insurance authorization.  Follow Up Recommendations Outpatient PT (Vestibular PT if symptoms persist)    Assistance Recommended at Discharge PRN  Patient can return home with the following       Equipment Recommendations None recommended by PT   Recommendations for Other Services       Functional Status Assessment Patient has not had a recent decline in their functional status     Precautions / Restrictions Precautions Precautions: Other (comment) Precaution Comments: watch BP, HR Restrictions Weight Bearing Restrictions: No      Mobility  Bed Mobility Overal bed mobility: Modified Independent             General bed mobility comments: No assist needed. Dizziness reported with transition to EOB however resolved promtly (<10 seconds)    Transfers Overall transfer level: Modified independent Equipment used: None               General transfer comment: Stood from EOB without difficulty, no dizziness.    Ambulation/Gait Ambulation/Gait assistance: Supervision, Modified independent (Device/Increase time)       Gait velocity: Decreased Gait velocity interpretation: 1.31 - 2.62 ft/sec, indicative of limited community ambulator   General Gait Details: Deferred gait training as HR jumping up as high as 145 bpm with basic position changes.  Stairs            Wheelchair Mobility    Modified Rankin (Stroke Patients Only)       Balance Overall balance assessment: Mild deficits observed, not formally tested                                           Pertinent Vitals/Pain Pain Assessment Pain Assessment: No/denies pain    Home Living Family/patient expects to be discharged to:: Private residence Living Arrangements: Alone Available Help at Discharge: Family;Available PRN/intermittently Type of Home: House Home Access: Level entry  Home Layout: One level Home Equipment: Shower seat - built in;Rollator (4 wheels);Cane - single point;Grab bars - tub/shower Additional Comments: reports son lives nearby    Prior Function Prior Level of Function : Independent/Modified Independent             Mobility Comments: Drives, cooks, cleans. ADLs Comments: very  independnet- driving, cooking, cleaning     Hand Dominance   Dominant Hand: Right    Extremity/Trunk Assessment   Upper Extremity Assessment Upper Extremity Assessment: Defer to OT evaluation    Lower Extremity Assessment Lower Extremity Assessment: Overall WFL for tasks assessed    Cervical / Trunk Assessment Cervical / Trunk Assessment: Other exceptions Cervical / Trunk Exceptions: Forward head posture with rounded shoulders`  Communication   Communication: No difficulties  Cognition Arousal/Alertness: Awake/alert Behavior During Therapy: WFL for tasks assessed/performed Overall Cognitive Status: Impaired/Different from baseline Area of Impairment: Memory, Attention, Awareness, Problem solving                   Current Attention Level: Sustained Memory: Decreased short-term memory     Awareness: Emergent Problem Solving: Requires verbal cues, Slow processing          General Comments      Exercises     Assessment/Plan    PT Assessment Patient does not need any further PT services  PT Problem List         PT Treatment Interventions      PT Goals (Current goals can be found in the Care Plan section)  Acute Rehab PT Goals Patient Stated Goal: Decreased dizziness PT Goal Formulation: All assessment and education complete, DC therapy    Frequency       Co-evaluation               AM-PAC PT "6 Clicks" Mobility  Outcome Measure Help needed turning from your back to your side while in a flat bed without using bedrails?: None Help needed moving from lying on your back to sitting on the side of a flat bed without using bedrails?: None Help needed moving to and from a bed to a chair (including a wheelchair)?: None Help needed standing up from a chair using your arms (e.g., wheelchair or bedside chair)?: None Help needed to walk in hospital room?: A Little Help needed climbing 3-5 steps with a railing? : A Little 6 Click Score: 22    End  of Session Equipment Utilized During Treatment: Gait belt Activity Tolerance: Patient tolerated treatment well Patient left: in bed;with call bell/phone within reach;with family/visitor present Nurse Communication: Mobility status PT Visit Diagnosis: BPPV;Dizziness and giddiness (R42)    Time: 7341-9379 PT Time Calculation (min) (ACUTE ONLY): 24 min   Charges:   PT Evaluation $PT Eval Low Complexity: 1 Low PT Treatments $Therapeutic Activity: 8-22 mins        Monica French, PT, DPT Acute Rehabilitation Services Pager: [phone] Office: [phone]   Thelma Comp 02/13/2021, 3:14 PM

## 2021-02-13 NOTE — Discharge Summary (Signed)
Physician Discharge Summary   Patient: Monica French MRN: [id number] DOB: 02/13/1931  Admit date:     02/10/2021  Discharge date: 02/13/21  Discharge Physician: Kerney Elbe   PCP: Street, Sharon Mt, MD   Recommendations at discharge:   Follow-up with PCP within 1 to 2 weeks for hospital discharge Follow-up with cardiology within 1 to 2 weeks and have further blood pressure management if needed Repeat CBC, CMP, mag, Phos within 1 week Follow-up with neurology for dizziness and suspected dementia  Discharge Diagnoses: Principal Problem:   Hyponatremia Active Problems:   Persistent atrial fibrillation (HCC)   Anxiety   Gastric ulcer   Resistant hypertension   AKI (acute kidney injury) ([place])   Hyperbilirubinemia   Dizziness   Dementia ([place])  Resolved Problems:   * No resolved hospital problems. [facility] Course: Patient is an elderly 88 year old Caucasian female with a past medical history significant for but not limited to persistent atrial fibrillation on anticoagulation with Eliquis, hypertension, history of gastric ulcer, anxiety, as well as hyperlipidemia who presents with concerns of dizziness as well as elevated blood pressure.  About a week ago she was noted to have acute dizziness and nausea with movement.  She presented to [facility] and was reportedly told everything was okay but then unfortunately there is no documentation in care everywhere.  She was noted to have fluctuating blood pressure range from systolic of 623J down to 628.  She normally takes benazepril and diltiazem chronically and blood pressures have been previously stable.  She saw her PCP because of her elevated blood pressures and he started on triamterene-hydrochlorothiazide 02/04/2021 in addition to clonidine 0.1 mg subsequently later when her blood pressure did not improve on 02/08/2021.  She reports continuing feeling weak and decreased appetite and felt nauseous but no vomiting.  Had 2  episodes of loose stools and diarrhea but had some intermittent bilateral facial numbness and had intermittent heart palpitations but no chest pain.  The symptoms have now resolved.  In the ED when she came in blood pressure ranging from systolic 315V to 761Y and she remained in A-fib which was rate controlled.  She was noted to have a significant hyponatremia likely in the setting of her diarrhea and diuretic usage.  Her triamterene-hydrochlorothiazide has been held and discontinued and her sodium is slowly trending up with fluid resuscitation.  Given her symptoms this head CT was ordered and was negative.  She was also ordered for an echocardiogram which showed an EF of 60 to 65% and the left ventricular diastolic function could not be evaluated due to her A-fib.  Right ventricular systolic function was normal and there is no significant change from her prior study.  PT OT evaluated and recommended no follow-up.    Given that her sodium was still on the lower side we will continue IV fluid hydration and monitor overnight and if is improved can likely be discharged home if her symptoms have improved and resolved however this AM she had Nausea and Dizziness. Na+ is now 128 so will continue IVF and obtain MRI. PT/OT recommending Home Health  MRI done and was relatively unremarkable.  Case was discussed with the neurologist Dr. Leonel Ramsay who felt that the patient's sodium was likely contributing to her dizziness and her dizziness was much improved today than yesterday.  She is no longer nauseous.  Sodium has trended up to 133 and the case is also discussed with the EP cardiologist Dr. Quentin Ore who felt  that the A-fib was not the cause of the patient's dizziness.  Patient underwent vestibular testing this was negative.  She had orthostatic vital signs done and she did not drop her orthostatic vital signs.  She ambulated quite well and she will be discharged with home health PT OT.  Patient has been a little  confused during her hospitalization and patient family has recently noticed that she is becoming more more forgetful.  We suspect that she may have some dementia and so we will make a referral to outpatient neurology [facility] prior to discharge.  Patient can also follow-up for further dizziness work-up if she continues to have dizziness as an outpatient.  Blood pressure at the time of discharge was stable and she was deemed stable for discharge home at this time.  Assessment and Plan: * Hyponatremia- (present on admission) -On admission she had a sodium of 122 and osmolality labs done and showed a serum osmolality of 254 -Suspected that her acute hyponatremia was in the setting of decreased appetite and being started on triamterene/hydrochlorothiazide as well as loose stools that she had -She started on normal saline at 75 cc/h we will continue given that her repeat sodium this AM is 133 -Given a 500 mL Bolus yesterday AM and will repeat CMP in outpatient setting as she is improved  Dementia ([place]) -Suspect that the patient has some form of dementia as she is forgetful -We will need outpatient follow-up for further dementia testing by the neurology team and the referral has been [facility] to [facility] neurology  Dizziness -Unclear etiology but has had nausea as well -Start Antiemetics -Given that the dizziness was at rest will obtain MRI and MRI done and showed "Age congruent senescent changes.  No acute or reversible finding." -PT/OT to evaluate and will need Vestibular Evaluation -Order TED Hose and given a 500 mL NS bolus yesterday -Repeat Orthostatic Vital Signs showed that she did not drop -Vestibular testing done and this was negative; PT recommending outpatient vestibular testing for further additional testing -Was no longer dizzy at the time of discharge and will need to follow-up with her PCP, cardiologist as well as neurologist in outpatient setting and referral has been made to general neurology for  the dizziness in case she continues to have  Hyperbilirubinemia - Patient's T bilirubin was 1.5 and improved to 1.1 with a indirect bilirubin of 0.9 and direct bilirubin of 0.2 yesterday: Repeat T Bili this AM is 0.2 -Continue IV fluid hydration and continue monitoring repeat CMP in a.m.  AKI (acute kidney injury) ([place]) - Unclear etiology but could be hemodynamic mediated given fluctuations in blood pressure in addition of the hydrochlorothiazide and triamterene -May consider holding her benazepril 41 p.o. daily given her worsening renal function -We will hold her nephrotoxic medications, and avoid contrast dyes, hypotension and renally dose medications -Patient BUN/creatinine slowly trended up and went from 14/0.86 -> 11/0.75 -> 13/1.34 -> 16/1.00 and today her BUN/creatinine is 12/0.70 -Continue with fluid hydration with normal saline at 75 mils per hour for 1 day and stopped this morning but removed prior to discharge until the time of discharge -Repeat CMP within 1 week   Resistant hypertension - Blood pressures were ranging from normotensive to being elevated up to 180s at home despite being on benazepril, diltiazem as well as recently added triamterene-hydrochlorothiazide and clonidine -We will continue all her antihypertensives except the triamterene hydrochlorothiazide due to her acute hyponatremia -Initial creatinine was not elevated and UA was not suggestive of renovascular  disease however creatinine is now elevated -We will continue IV fluid hydration and repeat echo and TSH done as below -Last echo in September 2022 showing EF of 50 to 55% with moderately elevated pulm artery systolic pressure with both atrium being moderately dilated and moderate mitral valve regurgitation as well as mild aortic valve regurgitation. -Blood pressures are improved and we have added IV hydralazine just in case she needs it as a as needed -Continue monitor blood pressures per protocol -BP was elevated  yesterday AM to 154/71 and patient was feeling dizzy but is now improved and she is not dizzy as her blood pressure is 126/62 -Check Orthostatics because of Dizziness and ordered and she did not drop -Continue with home blood pressure medications but discontinue triamterene hydrochlorothiazide.  We will continue her benazepril, diltiazem, clonidine have her follow-up with her PCP and cardiologist in outpatient setting   Gastric ulcer- (present on admission) -Continue her home PPI with pantoprazole 40 mg p.o. daily  Anxiety- (present on admission) -Continue with alprazolam 0.125 mg - 0.25 mg p.o. twice daily as needed anxiety  Persistent atrial fibrillation ([place])- (present on admission) -Has chronic atrial fibrillation has been having palpitations recently -Suspected that some of her symptoms could be due to atrial fibrillation with RVR however she is rate controlled -We will continue on telemetry -We will continue with anticoagulation with apixaban and rate control with diltiazem -Echocardiogram was checked and showed no acute changes from previous -TSH was done and showed a level of 1.195 -She felt dizzy yesterday AM so ? Rate related but HR was in the 80's to 90's and cardiology feels that this is less likely related to her A-fib; MRI of Brain ordered and unremarkable as above -May get Cardiology involved to evaluate however I spoke with Dr. Quentin Ore of EP who feels that her A-fib is relatively well controlled and recommends to continue to be on this regimen.   Pain control - [employer] Controlled Substance Reporting System database was reviewed. and patient was instructed, not to drive, operate heavy machinery, perform activities at heights, swimming or participation in water activities or provide baby-sitting services while on Pain, Sleep and Anxiety Medications; until their outpatient Physician has advised to do so again. Also recommended to not to take more than prescribed Pain, Sleep  and Anxiety Medications.   Consultants: Discussed the case with neurology Dr. Leonel Ramsay and discussed the case with cardiology Dr. Quentin Ore Procedures performed: Echocardiogram, head CT, MRI Disposition: Home health Diet recommendation:  Discharge Diet Orders (From admission, onward)     Start     Ordered   02/13/21 0000  Diet - low sodium heart healthy        02/13/21 1622           Cardiac diet  DISCHARGE MEDICATION: Allergies as of 02/13/2021       Reactions   Codeine Nausea And Vomiting        Medication List     STOP taking these medications    triamterene-hydrochlorothiazide 37.5-25 MG tablet Commonly known as: MAXZIDE-25       TAKE these medications    ALPRAZolam 0.25 MG tablet Commonly known as: XANAX Take 0.125-0.25 mg by mouth 2 (two) times daily as needed for anxiety.   benazepril 40 MG tablet Commonly known as: LOTENSIN Take 40 mg by mouth every morning.   Centrum Silver 50+Women Tabs Take by mouth.   cholecalciferol 25 MCG (1000 UNIT) tablet Commonly known as: VITAMIN D3 Take 1,000 Units by  mouth daily.   cloNIDine 0.1 MG tablet Commonly known as: CATAPRES Take 0.1 mg by mouth at bedtime.   diltiazem 30 MG tablet Commonly known as: Cardizem Take 1 tablet (30 mg total) by mouth 3 (three) times daily.   Eliquis 5 MG Tabs tablet Generic drug: apixaban TAKE 1 TABLET(5 MG) BY MOUTH TWICE DAILY What changed: See the new instructions.   omeprazole 20 MG capsule Commonly known as: PRILOSEC Take 1 capsule (20 mg total) by mouth daily.   Turmeric 500 MG Tabs Take 500 mg by mouth daily.   vitamin C 1000 MG tablet Take 1,000 mg by mouth daily.        Follow-up Information     Street, Sharon Mt, MD. Call.   Specialty: Family Medicine Why: Follow up within 1-2 weeks Contact information: [place] [address] [phone]         Richardo Priest, MD Follow up.   Specialties: Cardiology, Radiology Why:  Follow up within 1-2 weeks Contact information: [address] [place] [address] [facility]. Call.   Specialty: Neurology Why: Referral Made and they will be reaching out and if for some reason they don't number is provided Contact information: [address] [place] [phone]        [facility] referral pending Follow up.   Why: will call you in am with agency that will service you                Discharge Exam: Filed Weights   02/11/21 0117  Weight: 70 kg   Vitals:   02/13/21 0409 02/13/21 0805  BP: (!) 141/57 126/62  Pulse: 65 81  Resp: 20 (!) 22  Temp: 97.6 F (36.4 C) 97.8 F (36.6 C)  SpO2: 99% 96%   Examination: Physical Exam:  Constitutional: WN/WD elderly overweight Caucasian female in NAD and appears calm  Respiratory: Diminished  to auscultation bilaterally, no wheezing, rales, rhonchi or crackles. Normal respiratory effort and patient is not tachypenic. No accessory muscle use. Unlaboreded breathing  Cardiovascular: Irregularly Irregular, no murmurs / rubs / gallops. S1 and S2 auscultated.  No appreciable edema Abdomen: Soft, non-tender, distended secondary body.  Bowel sounds positive.  GU: Deferred.  Condition at discharge: stable  The results of significant diagnostics from this hospitalization (including imaging, microbiology, ancillary and laboratory) are listed below for reference.   Imaging Studies: CT Head Wo Contrast  Result Date: 02/10/2021 CLINICAL DATA:  Mental status change of unknown cause. Nausea and diarrhea. Hypertension. EXAM: CT HEAD WITHOUT CONTRAST TECHNIQUE: Contiguous axial images were obtained from the base of the skull through the vertex without intravenous contrast. RADIATION DOSE REDUCTION: This exam was performed according to the departmental dose-optimization program which includes automated exposure control, adjustment of the mA and/or kV  according to patient size and/or use of iterative reconstruction technique. COMPARISON:  02/02/2021 FINDINGS: Brain: Diffuse cerebral atrophy. No ventricular dilatation. Low-attenuation changes in the deep white matter consistent small vessel ischemia. Old lacunar infarct in the right basal ganglia. No change since previous study. No mass-effect or midline shift. No abnormal extra-axial fluid collections. Gray-white matter junctions are distinct. Basal cisterns are not effaced. No acute intracranial hemorrhage. Vascular: Moderate intracranial arterial vascular calcifications. Skull: Calvarium appears intact. Sinuses/Orbits: Paranasal sinuses and mastoid air cells are clear. Other: None. IMPRESSION: No acute intracranial abnormalities. Chronic atrophy and small vessel ischemic changes. Old lacunar infarct in the  right basal ganglia. Electronically Signed   By: Lucienne Capers M.D.   On: 02/10/2021 19:20   MR BRAIN WO CONTRAST  Result Date: 02/12/2021 CLINICAL DATA:  Dizziness, persistent or recurrent with cardiac or vascular cause suspected. History of atrial fibrillation on Eliquis. EXAM: MRI HEAD WITHOUT CONTRAST TECHNIQUE: Multiplanar, multiecho pulse sequences of the brain and surrounding structures were obtained without intravenous contrast. COMPARISON:  Head CT from 2 days ago FINDINGS: Brain: Prominent extra-axial subarachnoid spaces without cortical mass effect or vessel displacement. No acute infarct, hemorrhage, hydrocephalus, or collection. Mild for age chronic small vessel ischemia. Dilated perivascular space below the right putamen. Vascular: Normal flow voids. Skull and upper cervical spine: Normal marrow signal. Sinuses/Orbits: Negative. IMPRESSION: Age congruent senescent changes.  No acute or reversible finding. Electronically Signed   By: Jorje Guild M.D.   On: 02/12/2021 12:07   ECHOCARDIOGRAM COMPLETE  Result Date: 02/11/2021    ECHOCARDIOGRAM REPORT   Patient Name:   Monica French  Date of Exam: 02/11/2021 Medical Rec #:  [id number]        Height:       63.0 in Accession #:    [id number]       Weight:       154.3 lb Date of Birth:  May 14, 1931        BSA:          1.732 m Patient Age:    11 years         BP:           123/57 mmHg Patient Gender: F                HR:           86 bpm. Exam Location:  Inpatient Procedure: 2D Echo Indications:    atrial fibrillation  History:        Patient has prior history of Echocardiogram examinations, most                 recent 09/24/2020. Arrythmias:Atrial Fibrillation; Risk                 Factors:Hypertension.  Sonographer:    Johny Chess RDCS Referring Phys: [id number] [place]  1. Left ventricular ejection fraction, by estimation, is 60 to 65%. The left ventricle has normal function. The left ventricle has no regional wall motion abnormalities. Left ventricular diastolic function could not be evaluated.  2. Right ventricular systolic function is normal. The right ventricular size is normal. There is normal pulmonary artery systolic pressure.  3. Left atrial size was moderately dilated.  4. Right atrial size was mildly dilated.  5. The mitral valve is normal in structure. Mild to moderate mitral valve regurgitation. No evidence of mitral stenosis.  6. The aortic valve is normal in structure. Aortic valve regurgitation is mild. No aortic stenosis is present.  7. The inferior vena cava is normal in size with greater than 50% respiratory variability, suggesting right atrial pressure of 3 mmHg. Comparison(s): No significant change from prior study. Prior images reviewed side by side. FINDINGS  Left Ventricle: Left ventricular ejection fraction, by estimation, is 60 to 65%. The left ventricle has normal function. The left ventricle has no regional wall motion abnormalities. The left ventricular internal cavity size was normal in size. There is  no left ventricular hypertrophy. Left ventricular diastolic function could not be evaluated due to  atrial fibrillation. Left ventricular diastolic function could not be evaluated. Right Ventricle: The  right ventricular size is normal. No increase in right ventricular wall thickness. Right ventricular systolic function is normal. There is normal pulmonary artery systolic pressure. The tricuspid regurgitant velocity is 2.72 m/s, and  with an assumed right atrial pressure of 3 mmHg, the estimated right ventricular systolic pressure is 40.0 mmHg. Left Atrium: Left atrial size was moderately dilated. Right Atrium: Right atrial size was mildly dilated. Pericardium: There is no evidence of pericardial effusion. Mitral Valve: The mitral valve is normal in structure. Mild to moderate mitral valve regurgitation, with posteriorly-directed jet. No evidence of mitral valve stenosis. Tricuspid Valve: The tricuspid valve is normal in structure. Tricuspid valve regurgitation is mild . No evidence of tricuspid stenosis. Aortic Valve: The aortic valve is normal in structure. Aortic valve regurgitation is mild. No aortic stenosis is present. Pulmonic Valve: The pulmonic valve was normal in structure. Pulmonic valve regurgitation is not visualized. No evidence of pulmonic stenosis. Aorta: The aortic root is normal in size and structure. Venous: The inferior vena cava is normal in size with greater than 50% respiratory variability, suggesting right atrial pressure of 3 mmHg. IAS/Shunts: No atrial level shunt detected by color flow Doppler.  LEFT VENTRICLE PLAX 2D LVIDd:         5.10 cm LVIDs:         3.90 cm LV PW:         0.90 cm LV IVS:        0.90 cm LVOT diam:     2.00 cm LV SV:         46 LV SV Index:   26 LVOT Area:     3.14 cm  IVC IVC diam: 1.40 cm LEFT ATRIUM             Index        RIGHT ATRIUM           Index LA diam:        4.30 cm 2.48 cm/m   RA Area:     13.00 cm LA Vol (A2C):   57.9 ml 33.43 ml/m  RA Volume:   28.80 ml  16.63 ml/m LA Vol (A4C):   50.8 ml 29.33 ml/m LA Biplane Vol: 56.3 ml 32.51 ml/m  AORTIC  VALVE LVOT Vmax:   88.50 cm/s LVOT Vmean:  58.600 cm/s LVOT VTI:    0.146 m  AORTA Ao Root diam: 2.70 cm Ao Asc diam:  3.30 cm TRICUSPID VALVE TR Peak grad:   29.6 mmHg TR Vmax:        272.00 cm/s  SHUNTS Systemic VTI:  0.15 m Systemic Diam: 2.00 cm Dani Gobble Croitoru MD Electronically signed by Sanda Klein MD Signature Date/Time: 02/11/2021/4:06:41 PM    Final     Microbiology: Results for orders placed or performed during the hospital encounter of 02/10/21  Resp Panel by RT-PCR (Flu A&B, Covid) Urine, Clean Catch     Status: None   Collection Time: 02/10/21  9:38 PM   Specimen: Urine, Clean Catch; Nasopharyngeal(NP) swabs in vial transport medium  Result Value Ref Range Status   SARS Coronavirus 2 by RT PCR NEGATIVE NEGATIVE Final    Comment: (NOTE) SARS-CoV-2 target nucleic acids are NOT DETECTED.  The SARS-CoV-2 RNA is generally detectable in upper respiratory specimens during the acute phase of infection. The lowest concentration of SARS-CoV-2 viral copies this assay can detect is 138 copies/mL. A negative result does not preclude SARS-Cov-2 infection and should not be used as the sole basis for treatment or  other patient management decisions. A negative result may occur with  improper specimen collection/handling, submission of specimen other than nasopharyngeal swab, presence of viral mutation(s) within the areas targeted by this assay, and inadequate number of viral copies(<138 copies/mL). A negative result must be combined with clinical observations, patient history, and epidemiological information. The expected result is Negative.  Fact Sheet for Patients:  EntrepreneurPulse.com.au  Fact Sheet for Healthcare Providers:  IncredibleEmployment.be  This test is no t yet approved or cleared by the [place] FDA and  has been authorized for detection and/or diagnosis of SARS-CoV-2 by FDA under an Emergency Use Authorization (EUA). This EUA  will remain  in effect (meaning this test can be used) for the duration of the COVID-19 declaration under Section 564(b)(1) of the Act, 21 U.S.C.section 360bbb-3(b)(1), unless the authorization is terminated  or revoked sooner.       Influenza A by PCR NEGATIVE NEGATIVE Final   Influenza B by PCR NEGATIVE NEGATIVE Final    Comment: (NOTE) The Xpert Xpress SARS-CoV-2/FLU/RSV plus assay is intended as an aid in the diagnosis of influenza from Nasopharyngeal swab specimens and should not be used as a sole basis for treatment. Nasal washings and aspirates are unacceptable for Xpert Xpress SARS-CoV-2/FLU/RSV testing.  Fact Sheet for Patients: EntrepreneurPulse.com.au  Fact Sheet for Healthcare Providers: IncredibleEmployment.be  This test is not yet approved or cleared by the [place] FDA and has been authorized for detection and/or diagnosis of SARS-CoV-2 by FDA under an Emergency Use Authorization (EUA). This EUA will remain in effect (meaning this test can be used) for the duration of the COVID-19 declaration under Section 564(b)(1) of the Act, 21 U.S.C. section 360bbb-3(b)(1), unless the authorization is terminated or revoked.  Performed at [facility], [place] [address], [place], [place] [address]   MRSA Next Gen by PCR, Nasal     Status: None   Collection Time: 02/11/21  1:17 PM   Specimen: Nasal Mucosa; Nasal Swab  Result Value Ref Range Status   MRSA by PCR Next Gen NOT DETECTED NOT DETECTED Final    Comment: (NOTE) The GeneXpert MRSA Assay (FDA approved for NASAL specimens only), is one component of a comprehensive MRSA colonization surveillance program. It is not intended to diagnose MRSA infection nor to guide or monitor treatment for MRSA infections. Test performance is not FDA approved in patients less than 46 years old. Performed at [facility], [place] [address], [place], [place] [address]      Labs: CBC: Recent Labs  Lab 02/10/21 1803 02/11/21 1438 02/12/21 0057 02/13/21 0104  WBC 6.7 6.7 7.1 5.5  NEUTROABS 4.9 4.9 4.5 3.8  HGB 14.7 13.7 13.1 12.2  HCT 43.9 41.0 38.2 36.3  MCV 87.1 87.6 86.6 87.7  PLT 241 224 196 335   Basic Metabolic Panel: Recent Labs  Lab 02/10/21 1803 02/11/21 0123 02/11/21 1438 02/12/21 0057 02/13/21 0104  NA 122* 124* 125* 128* 133*  K 4.3 4.2 4.8 4.5 4.1  CL 88* 93* 93* 95* 105  CO2 23 21* 25 25 21*  GLUCOSE 108* 105* 131* 118* 118*  BUN 14 11 13 16 12   CREATININE 0.86 0.75 1.34* 1.00 0.78  CALCIUM 9.9 9.4 9.5 9.4 8.9  MG 1.9  --   --  1.8 2.0  PHOS  --   --   --  3.1 2.6   Liver Function Tests: Recent Labs  Lab 02/10/21 1803 02/11/21 1438 02/12/21 0057 02/13/21 0104  AST 32 22  20 15  ALT 13 15 13 14   ALKPHOS 71 65 62 54  BILITOT 1.5* 1.1 1.0 0.2*  PROT 6.6 6.2* 5.6* 5.1*  ALBUMIN 3.9 3.5 3.2* 2.9*   CBG: No results for input(s): GLUCAP in the last 168 hours.  Discharge time spent: greater than 30 minutes.  Signed: Raiford Noble, DO [facility] 02/13/2021

## 2021-02-13 NOTE — Assessment & Plan Note (Signed)
-  Suspect that the patient has some form of dementia as she is forgetful -We will need outpatient follow-up for further dementia testing by the neurology team and the referral has been made to [facility] neurology

## 2021-02-13 NOTE — Progress Notes (Signed)
°  Transition of Care [facility]) Screening Note   Patient Details  Name: Monica French Date of Birth: 12-14-1931   Transition of Care [facility]) CM/SW Contact:    Vinie Sill, LCSW Phone Number: 02/13/2021, 10:02 AM    Transition of Care Department [facility]) has reviewed patient and no TOC needs have been identified at this time. We will continue to monitor patient advancement through interdisciplinary progression rounds. If new patient transition needs arise, please place a TOC consult.

## 2021-02-14 NOTE — TOC Progression Note (Signed)
Transition of Care (TOC) - Progression Note  [name] RN, BSN Transitions of Care Unit 4E- RN Case Manager See Treatment Team for direct phone #  Weekend Cross Coverage  Patient Details  Name: Monica French MRN: [id number] Date of Birth: 1931-07-03  Transition of Care [facility]) CM/SW Contact  Dahlia Client, Romeo Rabon, RN Phone Number: 02/14/2021, 9:57 AM  Clinical Narrative:    Post discharge follow up done on [facility] referral made on Sunday 2/12- with [facility]- call made and spoke with Bonnita Nasuti- referral has been accepted and they have already reached out to pt for scheduling.   Call also made to patient to confirm she has heard from [facility]- pt has confirmed she has spoken with them and voiced appreciation for CM assistance and f/u. Pt voiced she is feeling better today after returning home.    Expected Discharge Plan: [place] Barriers to Discharge: No Barriers Identified  Expected Discharge Plan and Services Expected Discharge Plan: [place]   Discharge Planning Services: CM Consult Post Acute Care Choice: [place] arrangements for the past 2 months: Single Family Home Expected Discharge Date: 02/13/21               DME Arranged: N/A DME Agency: NA       HH Arranged: RN, PT, OT HH Agency: [place] Date [place]: 02/13/21 Time [place]: 1652 Representative spoke with at [place]: [place] ([place]) Interventions    Readmission Risk Interventions Readmission Risk Prevention Plan 02/13/2021  Post Dischage Appt Complete  Medication Screening Complete  Transportation Screening Complete  Some recent data might be hidden

## 2021-02-15 DIAGNOSIS — I10 Essential (primary) hypertension: Secondary | ICD-10-CM | POA: Diagnosis not present

## 2021-02-15 DIAGNOSIS — I4819 Other persistent atrial fibrillation: Secondary | ICD-10-CM | POA: Diagnosis not present

## 2021-02-15 DIAGNOSIS — Z7901 Long term (current) use of anticoagulants: Secondary | ICD-10-CM | POA: Diagnosis not present

## 2021-02-15 DIAGNOSIS — M1990 Unspecified osteoarthritis, unspecified site: Secondary | ICD-10-CM | POA: Diagnosis not present

## 2021-02-15 DIAGNOSIS — N179 Acute kidney failure, unspecified: Secondary | ICD-10-CM | POA: Diagnosis not present

## 2021-02-15 DIAGNOSIS — E871 Hypo-osmolality and hyponatremia: Secondary | ICD-10-CM | POA: Diagnosis not present

## 2021-02-17 DIAGNOSIS — D6869 Other thrombophilia: Secondary | ICD-10-CM | POA: Diagnosis not present

## 2021-02-17 DIAGNOSIS — I10 Essential (primary) hypertension: Secondary | ICD-10-CM | POA: Diagnosis not present

## 2021-02-17 DIAGNOSIS — E871 Hypo-osmolality and hyponatremia: Secondary | ICD-10-CM | POA: Diagnosis not present

## 2021-02-17 DIAGNOSIS — Z2821 Immunization not carried out because of patient refusal: Secondary | ICD-10-CM | POA: Diagnosis not present

## 2021-02-17 DIAGNOSIS — I482 Chronic atrial fibrillation, unspecified: Secondary | ICD-10-CM | POA: Diagnosis not present

## 2021-02-17 DIAGNOSIS — I672 Cerebral atherosclerosis: Secondary | ICD-10-CM | POA: Diagnosis not present

## 2021-02-22 DIAGNOSIS — N179 Acute kidney failure, unspecified: Secondary | ICD-10-CM | POA: Diagnosis not present

## 2021-02-22 DIAGNOSIS — I10 Essential (primary) hypertension: Secondary | ICD-10-CM | POA: Diagnosis not present

## 2021-02-22 DIAGNOSIS — M1990 Unspecified osteoarthritis, unspecified site: Secondary | ICD-10-CM | POA: Diagnosis not present

## 2021-02-22 DIAGNOSIS — Z7901 Long term (current) use of anticoagulants: Secondary | ICD-10-CM | POA: Diagnosis not present

## 2021-02-22 DIAGNOSIS — I4819 Other persistent atrial fibrillation: Secondary | ICD-10-CM | POA: Diagnosis not present

## 2021-02-22 DIAGNOSIS — E871 Hypo-osmolality and hyponatremia: Secondary | ICD-10-CM | POA: Diagnosis not present

## 2021-03-01 DIAGNOSIS — I10 Essential (primary) hypertension: Secondary | ICD-10-CM | POA: Diagnosis not present

## 2021-03-01 DIAGNOSIS — I4819 Other persistent atrial fibrillation: Secondary | ICD-10-CM | POA: Diagnosis not present

## 2021-03-07 DIAGNOSIS — Z7901 Long term (current) use of anticoagulants: Secondary | ICD-10-CM | POA: Diagnosis not present

## 2021-03-07 DIAGNOSIS — E871 Hypo-osmolality and hyponatremia: Secondary | ICD-10-CM | POA: Diagnosis not present

## 2021-03-07 DIAGNOSIS — N179 Acute kidney failure, unspecified: Secondary | ICD-10-CM | POA: Diagnosis not present

## 2021-03-07 DIAGNOSIS — M1990 Unspecified osteoarthritis, unspecified site: Secondary | ICD-10-CM | POA: Diagnosis not present

## 2021-03-07 DIAGNOSIS — I10 Essential (primary) hypertension: Secondary | ICD-10-CM | POA: Diagnosis not present

## 2021-03-07 DIAGNOSIS — I4819 Other persistent atrial fibrillation: Secondary | ICD-10-CM | POA: Diagnosis not present

## 2021-03-08 ENCOUNTER — Ambulatory Visit: Payer: Medicare Other | Admitting: Neurology

## 2021-03-09 NOTE — Progress Notes (Signed)
?Cardiology Office Note:   ? ?Date:  03/10/2021  ? ?ID:  Monica French, DOB November 02, 1931, MRN [id number] ? ?PCP:  Street, Sharon Mt, MD  ?Cardiologist:  Shirlee More, MD   ? ?Referring MD: Street, Sharon Mt, *  ? ? ?ASSESSMENT:   ? ?1. Persistent atrial fibrillation ([place])   ?2. Secondary hypercoagulable state ([place])   ?3. Hypertensive heart disease without heart failure   ?4. Chronic anticoagulation   ?5. Hyponatremia   ? ?PLAN:   ? ?In order of problems listed above: ? ?I discussed with the patient and her son she has longstanding atrial fibrillation I would not advise attempts to resume sinus rhythm she is asymptomatic with rate control anticoagulated without bleeding and I cannot improve the quality of her life.  They agree. ?Continue her anticoagulant ?Stable I told him to avoid triamterene in the future ?Improved I asked him to fluid restrict to 2.5 L/day ? ? ?Next appointment: 6 months ? ? ?Medication Adjustments/Labs and Tests Ordered: ?Current medicines are reviewed at length with the patient today.  Concerns regarding medicines are outlined above.  ?No orders of the defined types were placed in this encounter. ? ?No orders of the defined types were placed in this encounter. ? ? ?Chief Complaint  ?Patient presents with  ? Follow-up  ? Atrial Fibrillation  ? ? ?History of Present Illness:   ? ?Monica French is a 88 y.o. female with a hx of  persistent atrial fibrillation hypertension hyperlipidemia history of gastric ulcer land she opted for rate control and anticoagulation.  She was last seen 11/05/2020 with rate controlled atrial fibrillation and anticoagulated. ? ?Compliance with diet, lifestyle and medications: Yes ? ?She is seen today because it was communicated to the patient and her son that she needed to have atrial fibrillation corrected and it contributed to hyponatremia despite the communication with the cardiologist in the hospital ?Patient's son is good healthcare knowledge and I  explained to him that hyponatremia is dilutional unrelated to her atrial arrhythmia and he tells me he thinks she was placed on a diuretic for hypertension prior to the rapid drop in her sodium.  She also has a long history of drinking large volumes of water because she was told it was good for her. ?Since she returned home her thought is returned to normal no longer dizzy and her home blood pressures are in range. ?She has no awareness of atrial fibrillation palpitations syncope chest pain or edema and she tolerates her anticoagulant without bleeding ?The patient and her son are very hesitant to have procedures done because of how sick she was and a poor outcome with the father with cardiac interventions complication and succumbed ? ?She was admitted to [facility] 02/10/2021 discharge 02/13/2021 with a discharge diagnosis of hyponatremia related to diuretic therapy nausea and diarrhea.  There is discussion with EP Dr. Quentin Ore who felt that her atrial fibrillation is chronic and unrelated to her CNS side effects with hyponatremia.  Her admission sodium was 122 discharge 133.  Other problems included dementia dizziness elevated bilirubin and worsening renal function in hospital with a discharge creatinine of 0.70.  Blood pressure is difficult to control in hospital.  Her atrial fibrillation was rate controlled.  Although there was a phone discussion there was no consultation to cardiology. ? ?An echocardiogram performed 02/11/2021 left ventricle normal in size wall thickness EF 60 to 65% right ventricle normal in size wall thickness and pulmonary artery pressure right atrium is mildly  dilated left atrium moderately dilated there is mild to moderate mitral regurgitation and mild aortic regurgitation is seen. ?Past Medical History:  ?Diagnosis Date  ? Anxiety   ? Arthritis   ? Gastric ulcer   ? Hyperlipidemia   ? Hypertension   ? ? ?Past Surgical History:  ?Procedure Laterality Date  ? ABDOMINAL HYSTERECTOMY     ? CHOLECYSTECTOMY    ? GYN surgery    ? ? ?Current Medications: ?Current Meds  ?Medication Sig  ? ALPRAZolam (XANAX) 0.25 MG tablet Take 0.125-0.25 mg by mouth 2 (two) times daily as needed for anxiety.  ? Ascorbic Acid (VITAMIN C) 1000 MG tablet Take 1,000 mg by mouth daily.  ? benazepril (LOTENSIN) 40 MG tablet Take 40 mg by mouth every morning.  ? cholecalciferol (VITAMIN D3) 25 MCG (1000 UNIT) tablet Take 1,000 Units by mouth daily.  ? cloNIDine (CATAPRES) 0.1 MG tablet Take 0.1 mg by mouth at bedtime.  ? diltiazem (CARDIZEM) 30 MG tablet Take 1 tablet (30 mg total) by mouth 3 (three) times daily.  ? ELIQUIS 5 MG TABS tablet TAKE 1 TABLET(5 MG) BY MOUTH TWICE DAILY  ? Multiple Vitamins-Minerals (CENTRUM SILVER 50+WOMEN) TABS Take by mouth.  ? omeprazole (PRILOSEC) 20 MG capsule Take 1 capsule (20 mg total) by mouth daily.  ?  ? ?Allergies:   Codeine and Triamterene  ? ?Social History  ? ?Socioeconomic History  ? Marital status: Widowed  ?  Spouse name: Not on file  ? Number of children: Not on file  ? Years of education: Not on file  ? Highest education level: Not on file  ?Occupational History  ? Not on file  ?Tobacco Use  ? Smoking status: Never  ?  Passive exposure: Never  ? Smokeless tobacco: Never  ?Vaping Use  ? Vaping Use: Never used  ?Substance and Sexual Activity  ? Alcohol use: Never  ? Drug use: Never  ? Sexual activity: Not on file  ?Other Topics Concern  ? Not on file  ?Social History Narrative  ? Not on file  ? ?Social Determinants of Health  ? ?Financial Resource Strain: Not on file  ?Food Insecurity: Not on file  ?Transportation Needs: Not on file  ?Physical Activity: Not on file  ?Stress: Not on file  ?Social Connections: Not on file  ?  ? ?Family History: ?The patient's family history includes Cancer - Lung in her brother; Heart attack in her father; Heart disease in her brother and mother. ?ROS:   ?Please see the history of present illness.    ?All other systems reviewed and are  negative. ? ?EKGs/Labs/Other Studies Reviewed:   ? ?The following studies were reviewed today: ? ? ?Recent Labs: ?02/11/2021: TSH 1.195 ?02/13/2021: ALT 14; BUN 12; Creatinine, Ser 0.78; Hemoglobin 12.2; Magnesium 2.0; Platelets 175; Potassium 4.1; Sodium 133  ? ? ?Physical Exam:   ? ?VS:  BP (!) 154/70 (BP Location: Right Arm)   Pulse 74   Ht '5\' 3"'$  (1.6 m)   Wt 148 lb (67.1 kg)   SpO2 98%   BMI 26.22 kg/m?    ? ?Wt Readings from Last 3 Encounters:  ?03/10/21 148 lb (67.1 kg)  ?02/11/21 154 lb 5.2 oz (70 kg)  ?11/05/20 152 lb (68.9 kg)  ?  ? ?GEN: She appears her age depends on her son for medical decision making and looks frail  in no acute distress ?HEENT: Normal ?NECK: No JVD; No carotid bruits ?LYMPHATICS: No lymphadenopathy ?CARDIAC: Irregular rate and rhythm  no murmurs, rubs, gallops ?RESPIRATORY:  Clear to auscultation without rales, wheezing or rhonchi  ?ABDOMEN: Soft, non-tender, non-distended ?MUSCULOSKELETAL:  No edema; No deformity  ?SKIN: Warm and dry ?NEUROLOGIC:  Alert and oriented x 3 ?PSYCHIATRIC:  Normal affect  ? ? ?Signed, ?Shirlee More, MD  ?03/10/2021 10:39 AM    ?[place]  ?

## 2021-03-10 ENCOUNTER — Encounter: Payer: Self-pay | Admitting: Cardiology

## 2021-03-10 ENCOUNTER — Other Ambulatory Visit: Payer: Self-pay

## 2021-03-10 ENCOUNTER — Ambulatory Visit: Payer: Medicare Other | Admitting: Cardiology

## 2021-03-10 VITALS — BP 154/70 | HR 74 | Ht 63.0 in | Wt 148.0 lb

## 2021-03-10 DIAGNOSIS — I4819 Other persistent atrial fibrillation: Secondary | ICD-10-CM | POA: Diagnosis not present

## 2021-03-10 DIAGNOSIS — Z7901 Long term (current) use of anticoagulants: Secondary | ICD-10-CM

## 2021-03-10 DIAGNOSIS — E871 Hypo-osmolality and hyponatremia: Secondary | ICD-10-CM

## 2021-03-10 DIAGNOSIS — D6869 Other thrombophilia: Secondary | ICD-10-CM

## 2021-03-10 DIAGNOSIS — I119 Hypertensive heart disease without heart failure: Secondary | ICD-10-CM | POA: Diagnosis not present

## 2021-03-10 NOTE — Patient Instructions (Signed)
Medication Instructions:  ?Your physician recommends that you continue on your current medications as directed. Please refer to the Current Medication list given to you today. ? ?Restrict fluid to 2.5 Liters per day ? ?*If you need a refill on your cardiac medications before your next appointment, please call your pharmacy* ? ? ?Lab Work: ?NONE ?If you have labs (blood work) drawn today and your tests are completely normal, you will receive your results only by: ?MyChart Message (if you have MyChart) OR ?A paper copy in the mail ?If you have any lab test that is abnormal or we need to change your treatment, we will call you to review the results. ? ? ?Testing/Procedures: ?NONE ? ? ?Follow-Up: ?At [facility], you and your health needs are our priority.  As part of our continuing mission to provide you with exceptional heart care, we have created designated Provider Care Teams.  These Care Teams include your primary Cardiologist (physician) and Advanced Practice Providers (APPs -  Physician Assistants and Nurse Practitioners) who all work together to provide you with the care you need, when you need it. ? ?We recommend signing up for the patient portal called "MyChart".  Sign up information is provided on this After Visit Summary.  MyChart is used to connect with patients for Virtual Visits (Telemedicine).  Patients are able to view lab/test results, encounter notes, upcoming appointments, etc.  Non-urgent messages can be sent to your provider as well.   ?To learn more about what you can do with MyChart, go to [email or web address].   ? ?Your next appointment:   ?6 month(s) ? ?The format for your next appointment:   ?In Person ? ?Provider:   ?Shirlee More, MD  ? ? ?Other Instructions ?  ?

## 2021-03-22 DIAGNOSIS — I10 Essential (primary) hypertension: Secondary | ICD-10-CM | POA: Diagnosis not present

## 2021-03-22 DIAGNOSIS — E871 Hypo-osmolality and hyponatremia: Secondary | ICD-10-CM | POA: Diagnosis not present

## 2021-03-22 DIAGNOSIS — I4819 Other persistent atrial fibrillation: Secondary | ICD-10-CM | POA: Diagnosis not present

## 2021-03-22 DIAGNOSIS — M1990 Unspecified osteoarthritis, unspecified site: Secondary | ICD-10-CM | POA: Diagnosis not present

## 2021-03-22 DIAGNOSIS — N179 Acute kidney failure, unspecified: Secondary | ICD-10-CM | POA: Diagnosis not present

## 2021-03-22 DIAGNOSIS — Z7901 Long term (current) use of anticoagulants: Secondary | ICD-10-CM | POA: Diagnosis not present

## 2021-04-21 DIAGNOSIS — D692 Other nonthrombocytopenic purpura: Secondary | ICD-10-CM | POA: Diagnosis not present

## 2021-04-21 DIAGNOSIS — I672 Cerebral atherosclerosis: Secondary | ICD-10-CM | POA: Diagnosis not present

## 2021-04-21 DIAGNOSIS — I10 Essential (primary) hypertension: Secondary | ICD-10-CM | POA: Diagnosis not present

## 2021-04-21 DIAGNOSIS — I482 Chronic atrial fibrillation, unspecified: Secondary | ICD-10-CM | POA: Diagnosis not present

## 2021-04-21 DIAGNOSIS — M8580 Other specified disorders of bone density and structure, unspecified site: Secondary | ICD-10-CM | POA: Diagnosis not present

## 2021-04-21 DIAGNOSIS — E785 Hyperlipidemia, unspecified: Secondary | ICD-10-CM | POA: Diagnosis not present

## 2021-04-21 DIAGNOSIS — M1991 Primary osteoarthritis, unspecified site: Secondary | ICD-10-CM | POA: Diagnosis not present

## 2021-04-21 DIAGNOSIS — M353 Polymyalgia rheumatica: Secondary | ICD-10-CM | POA: Diagnosis not present

## 2021-04-21 DIAGNOSIS — G720 Drug-induced myopathy: Secondary | ICD-10-CM | POA: Diagnosis not present

## 2021-04-21 DIAGNOSIS — R7301 Impaired fasting glucose: Secondary | ICD-10-CM | POA: Diagnosis not present

## 2021-04-21 DIAGNOSIS — Z0000 Encounter for general adult medical examination without abnormal findings: Secondary | ICD-10-CM | POA: Diagnosis not present

## 2021-04-21 DIAGNOSIS — D6869 Other thrombophilia: Secondary | ICD-10-CM | POA: Diagnosis not present

## 2021-04-22 ENCOUNTER — Telehealth: Payer: Self-pay | Admitting: Cardiology

## 2021-04-22 DIAGNOSIS — I480 Paroxysmal atrial fibrillation: Secondary | ICD-10-CM

## 2021-04-22 MED ORDER — APIXABAN 5 MG PO TABS: 5.0000 mg | ORAL_TABLET | Freq: Two times a day (BID) | ORAL | 1 refills | Status: DC

## 2021-04-22 NOTE — Telephone Encounter (Signed)
?*  STAT* If patient is at the pharmacy, call can be transferred to refill team. ? ? ?1. Which medications need to be refilled? (please list name of each medication and dose if known)  ? ELIQUIS 5 MG TABS tablet  ? ? ?2. Which pharmacy/location (including street and city if local pharmacy) is medication to be sent to? [facility] [phone] - [place], [address] AT [address] ? ?3. Do they need a 30 day or 90 day supply? 90 day ? ?

## 2021-04-22 NOTE — Telephone Encounter (Signed)
Prescription refill request for Eliquis received. ?Indication: a fib ?Last office visit: 3/9/2 ?Scr: 0.78 ?Age: 88 ?Weight: 67kg ?

## 2021-05-08 ENCOUNTER — Other Ambulatory Visit (HOSPITAL_COMMUNITY): Payer: Self-pay | Admitting: Cardiology

## 2021-05-08 DIAGNOSIS — I480 Paroxysmal atrial fibrillation: Secondary | ICD-10-CM

## 2021-05-09 NOTE — Telephone Encounter (Signed)
Prescription refill request for Eliquis received. ?Indication:Afib ?Last office visit:3/23 ?Scr:0.7 ?Age: 88 ?Weight:67.1 kg ? ?Prescription refilled ? ?

## 2021-05-12 ENCOUNTER — Telehealth: Payer: Self-pay | Admitting: Cardiology

## 2021-05-12 NOTE — Telephone Encounter (Signed)
Pt c/o medication issue: ? ?1. Name of Medication: Instaflex collagen  ? ?2. How are you currently taking this medication (dosage and times per day)? Not taking yet ? ?3. Are you having a reaction (difficulty breathing--STAT)? no ? ?4. What is your medication issue?   ?Pt wants to know if this will be safe to take with her heart medication.  ?

## 2021-05-12 NOTE — Telephone Encounter (Signed)
Called patient and she is concerned about starting this new medication, Instaflex collagen, and wants to know if it will be safe to take this with her other heart medication. Will forward to Dr. Agustin Cree, who is covering for Dr. Bettina Gavia.Please advise. ?

## 2021-05-12 NOTE — Telephone Encounter (Signed)
Called patient and informed her of Dr. Wendy Poet recommendation below: ? ?"Even though this medications heavily have the test on the Internet there is no scientific data on this medication.  There are some data about this medication raising blood pressure.  So honestly I do not think we can scientifically advise on this medication " ? ?Patient stated she was going to try the medication and if she had side effects she would give the office a call. Patient had no further questions at this time. ?

## 2021-05-20 ENCOUNTER — Ambulatory Visit: Payer: Medicare Other | Admitting: Cardiology

## 2021-06-09 DIAGNOSIS — M170 Bilateral primary osteoarthritis of knee: Secondary | ICD-10-CM | POA: Diagnosis not present

## 2021-07-21 ENCOUNTER — Emergency Department (HOSPITAL_COMMUNITY): Payer: Medicare Other

## 2021-07-21 ENCOUNTER — Emergency Department (HOSPITAL_COMMUNITY)
Admission: EM | Admit: 2021-07-21 | Discharge: 2021-07-21 | Disposition: A | Discharge status: Home / Self Care | Payer: Medicare Other | Attending: Emergency Medicine | Admitting: Emergency Medicine

## 2021-07-21 ENCOUNTER — Other Ambulatory Visit: Payer: Self-pay

## 2021-07-21 DIAGNOSIS — I4891 Unspecified atrial fibrillation: Secondary | ICD-10-CM | POA: Diagnosis not present

## 2021-07-21 DIAGNOSIS — R197 Diarrhea, unspecified: Secondary | ICD-10-CM | POA: Diagnosis not present

## 2021-07-21 DIAGNOSIS — I700 Atherosclerosis of aorta: Secondary | ICD-10-CM | POA: Diagnosis not present

## 2021-07-21 DIAGNOSIS — R1031 Right lower quadrant pain: Secondary | ICD-10-CM

## 2021-07-21 DIAGNOSIS — R1084 Generalized abdominal pain: Secondary | ICD-10-CM | POA: Diagnosis not present

## 2021-07-21 DIAGNOSIS — R109 Unspecified abdominal pain: Secondary | ICD-10-CM | POA: Diagnosis not present

## 2021-07-21 DIAGNOSIS — Z7901 Long term (current) use of anticoagulants: Secondary | ICD-10-CM | POA: Insufficient documentation

## 2021-07-21 LAB — DIFFERENTIAL
Abs Immature Granulocytes: 0.02 10*3/uL (ref 0.00–0.07)
Basophils Absolute: 0 10*3/uL (ref 0.0–0.1)
Basophils Relative: 0 %
Eosinophils Absolute: 0 10*3/uL (ref 0.0–0.5)
Eosinophils Relative: 0 %
Immature Granulocytes: 0 %
Lymphocytes Relative: 24 %
Lymphs Abs: 1.3 10*3/uL (ref 0.7–4.0)
Monocytes Absolute: 0.3 10*3/uL (ref 0.1–1.0)
Monocytes Relative: 5 %
Neutro Abs: 3.8 10*3/uL (ref 1.7–7.7)
Neutrophils Relative %: 71 %

## 2021-07-21 LAB — URINALYSIS, ROUTINE W REFLEX MICROSCOPIC
Bilirubin Urine: NEGATIVE
Glucose, UA: NEGATIVE mg/dL
Hgb urine dipstick: NEGATIVE
Ketones, ur: NEGATIVE mg/dL
Nitrite: NEGATIVE
Protein, ur: NEGATIVE mg/dL
Specific Gravity, Urine: 1.005 (ref 1.005–1.030)
pH: 6 (ref 5.0–8.0)

## 2021-07-21 LAB — CBC
HCT: 40.4 % (ref 36.0–46.0)
Hemoglobin: 13 g/dL (ref 12.0–15.0)
MCH: 28.9 pg (ref 26.0–34.0)
MCHC: 32.2 g/dL (ref 30.0–36.0)
MCV: 89.8 fL (ref 80.0–100.0)
Platelets: 198 10*3/uL (ref 150–400)
RBC: 4.5 MIL/uL (ref 3.87–5.11)
RDW: 13.6 % (ref 11.5–15.5)
WBC: 5.5 10*3/uL (ref 4.0–10.5)
nRBC: 0 % (ref 0.0–0.2)

## 2021-07-21 LAB — COMPREHENSIVE METABOLIC PANEL
ALT: 11 U/L (ref 0–44)
AST: 18 U/L (ref 15–41)
Albumin: 4.1 g/dL (ref 3.5–5.0)
Alkaline Phosphatase: 73 U/L (ref 38–126)
Anion gap: 8 (ref 5–15)
BUN: 11 mg/dL (ref 8–23)
CO2: 26 mmol/L (ref 22–32)
Calcium: 10 mg/dL (ref 8.9–10.3)
Chloride: 107 mmol/L (ref 98–111)
Creatinine, Ser: 0.9 mg/dL (ref 0.44–1.00)
GFR, Estimated: >60 mL/min (ref >60)
Glucose, Bld: 122 mg/dL — ABNORMAL HIGH (ref 70–99)
Potassium: 3.7 mmol/L (ref 3.5–5.1)
Sodium: 141 mmol/L (ref 135–145)
Total Bilirubin: 1.2 mg/dL (ref 0.3–1.2)
Total Protein: 6.8 g/dL (ref 6.5–8.1)

## 2021-07-21 LAB — LIPASE, BLOOD: Lipase: 24 U/L (ref 11–51)

## 2021-07-21 MED ORDER — IOHEXOL 300 MG/ML  SOLN
100.0000 mL | Freq: Once | INTRAMUSCULAR | Status: AC | PRN
  Administered 2021-07-21: 100 mL via INTRAVENOUS

## 2021-07-21 MED ORDER — DICYCLOMINE HCL 20 MG PO TABS: 20.0000 mg | ORAL_TABLET | Freq: Two times a day (BID) | ORAL | 0 refills | Status: DC | PRN

## 2021-07-21 NOTE — ED Triage Notes (Signed)
Pt c/o of increase RLQ abd. Pain since last night. Pt also endorses diarrhea x 2 days.  HX: afib

## 2021-07-21 NOTE — ED Provider Notes (Signed)
[facility] EMERGENCY DEPARTMENT Provider Note   CSN: [id number] Arrival date & time: 07/21/21  1062     History  Chief Complaint  Patient presents with   Abdominal Pain    Monica French is a 88 y.o. female hx of A-fib on Eliquis, here presenting with abdominal pain.  Patient had an episode of diarrhea yesterday.  She then had right lower quadrant pain yesterday.  Patient was in pain on arrival to the ED.  Patient states that her pain has resolved now.  Denies any nausea or vomiting or fevers.  Denies any sick contacts  The history is provided by the patient.       Home Medications Prior to Admission medications   Medication Sig Start Date End Date Taking? Authorizing Provider  ALPRAZolam (XANAX) 0.25 MG tablet Take 0.125-0.25 mg by mouth 2 (two) times daily as needed for anxiety. 06/11/20   [provider]  apixaban (ELIQUIS) 5 MG TABS tablet TAKE 1 TABLET(5 MG) BY MOUTH TWICE DAILY 05/09/21   Richardo Priest, MD  Ascorbic Acid (VITAMIN C) 1000 MG tablet Take 1,000 mg by mouth daily.    [provider]  benazepril (LOTENSIN) 40 MG tablet Take 40 mg by mouth every morning. 06/11/20   [provider]  cholecalciferol (VITAMIN D3) 25 MCG (1000 UNIT) tablet Take 1,000 Units by mouth daily.    [provider]  cloNIDine (CATAPRES) 0.1 MG tablet Take 0.1 mg by mouth at bedtime. 02/08/21   [provider]  diltiazem (CARDIZEM) 30 MG tablet Take 1 tablet (30 mg total) by mouth 3 (three) times daily. 10/01/20   Richardo Priest, MD  Multiple Vitamins-Minerals (CENTRUM SILVER 50+WOMEN) TABS Take by mouth.    [provider]  omeprazole (PRILOSEC) 20 MG capsule Take 1 capsule (20 mg total) by mouth daily. 10/01/20   Richardo Priest, MD      Allergies    Codeine and Triamterene    Review of Systems   Review of Systems  Gastrointestinal:  Positive for abdominal pain.  All other systems reviewed and are  negative.   Physical Exam Updated Vital Signs BP (!) 175/100 (BP Location: Left Arm)   Pulse (!) 56   Temp 97.9 F (36.6 C) (Oral)   Resp 17   SpO2 98%  Physical Exam Vitals and nursing note reviewed.  HENT:     Head: Normocephalic.     Mouth/Throat:     Mouth: Mucous membranes are moist.     Pharynx: Oropharynx is clear.  Eyes:     Extraocular Movements: Extraocular movements intact.     Pupils: Pupils are equal, round, and reactive to light.  Cardiovascular:     Rate and Rhythm: Normal rate and regular rhythm.     Heart sounds: Normal heart sounds.  Pulmonary:     Effort: Pulmonary effort is normal.     Breath sounds: Normal breath sounds.  Abdominal:     General: Abdomen is flat.     Comments: Mild diffuse tenderness, no rebound or guarding   Skin:    General: Skin is warm.     Capillary Refill: Capillary refill takes less than 2 seconds.  Neurological:     General: No focal deficit present.     Mental Status: She is alert and oriented to person, place, and time.  Psychiatric:        Mood and Affect: Mood normal.        Behavior: Behavior normal.  ED Results / Procedures / Treatments   Labs (all labs ordered are listed, but only abnormal results are displayed) Labs Reviewed  COMPREHENSIVE METABOLIC PANEL - Abnormal; Notable for the following components:      Result Value   Glucose, Bld 122 (*)    All other components within normal limits  URINALYSIS, ROUTINE W REFLEX MICROSCOPIC - Abnormal; Notable for the following components:   Leukocytes,Ua MODERATE (*)    Bacteria, UA RARE (*)    All other components within normal limits  LIPASE, BLOOD  CBC  DIFFERENTIAL    EKG None  Radiology CT ABDOMEN PELVIS W CONTRAST  Result Date: 07/21/2021 CLINICAL DATA:  Right lower quadrant abdominal pain since last night, diarrhea EXAM: CT ABDOMEN AND PELVIS WITH CONTRAST TECHNIQUE: Multidetector CT imaging of the abdomen and pelvis was performed using the standard  protocol following bolus administration of intravenous contrast. RADIATION DOSE REDUCTION: This exam was performed according to the departmental dose-optimization program which includes automated exposure control, adjustment of the mA and/or kV according to patient size and/or use of iterative reconstruction technique. CONTRAST:  127m OMNIPAQUE IOHEXOL 300 MG/ML  SOLN COMPARISON:  None Available. FINDINGS: Lower chest: No acute abnormality. Cardiomegaly. Coronary artery calcifications. Hepatobiliary: No focal liver abnormality is seen. Status post cholecystectomy. Mild postoperative biliary dilatation. Pancreas: Unremarkable. No pancreatic ductal dilatation or surrounding inflammatory changes. Spleen: Normal in size without significant abnormality. Adrenals/Urinary Tract: Adrenal glands are unremarkable. Simple, benign renal cortical cyst of the inferior pole of the right kidney for which no further follow-up or characterization is required (series 3, image 38). Kidneys are otherwise normal, without renal calculi, solid lesion, or hydronephrosis. Bladder is unremarkable. Stomach/Bowel: Stomach is within normal limits. Appendix is not clearly visualized and may be surgically absent. No evidence of bowel wall thickening, distention, or inflammatory changes. Sigmoid diverticula. Vascular/Lymphatic: Aortic atherosclerosis. No enlarged abdominal or pelvic lymph nodes. Reproductive: Status post hysterectomy. Other: No abdominal wall hernia or abnormality. No ascites. Musculoskeletal: No acute or significant osseous findings. IMPRESSION: 1. No acute CT findings of the abdomen or pelvis to explain right lower quadrant pain. Appendix is not clearly visualized and may be surgically absent. 2. Sigmoid diverticulosis without evidence of acute diverticulitis. 3. Status post cholecystectomy and hysterectomy. 4. Coronary artery disease. Aortic Atherosclerosis (ICD10-I70.0). Electronically Signed   By: ADelanna AhmadiM.D.   On:  07/21/2021 16:13    Procedures Procedures    Medications Ordered in ED Medications  iohexol (OMNIPAQUE) 300 MG/ML solution 100 mL (100 mLs Intravenous Contrast Given 07/21/21 1559)    ED Course/ Medical Decision Making/ A&P                           Medical Decision Making VBETHENNY LOSEEis a 88y.o. female hx of A-fib on Eliquis, here presenting with lower abdominal pain and diarrhea.  I think likely gastroenteritis versus colitis versus UTI.  Plan to get CBC and CMP and UA and CT abdomen pelvis  5:05 PM I reviewed patient's labs and independently interpreted imaging studies. WBC is normal. UA normal.  CT showed no obvious appendicitis or colitis.  Patient's pain is under control.  Stable for discharge  Amount and/or Complexity of Data Reviewed Labs: ordered. Decision-making details documented in ED Course. Radiology: ordered and independent interpretation performed. Decision-making details documented in ED Course.    Final Clinical Impression(s) / ED Diagnoses Final diagnoses:  None    Rx / DC Orders ED  Discharge Orders     None         Drenda Freeze, MD 07/21/21 [phone]

## 2021-07-21 NOTE — ED Provider Triage Note (Signed)
Emergency Medicine Provider Triage Evaluation Note  Monica French , a 88 y.o. female  was evaluated in triage.  Pt complains of RLQ abd pain since last night around midnight. Episodic, severe. No urinary sx. Some diarrhea over the past 24 hours.   No vomiting \\or nausea.   Hx of cholecystectomy. No hx of kidney stones.   Review of Systems  Positive: Abd pain Negative: Fever   Physical Exam  There were no vitals taken for this visit. Gen:   Awake, no distress   Resp:  Normal effort  MSK:   Moves extremities without difficulty  Other:  Abd soft NTTP  Medical Decision Making  Medically screening exam initiated at 8:58 AM.  Appropriate orders placed.  Monica French was informed that the remainder of the evaluation will be completed by another provider, this initial triage assessment does not replace that evaluation, and the importance of remaining in the ED until their evaluation is complete.  Labs, CT   Pati Gallo [place], [place] 07/21/21 [phone]

## 2021-07-21 NOTE — Discharge Instructions (Addendum)
Your CT scan and your labs were unremarkable today  Please take Bentyl as needed for cramps  You may take Imodium if you have diarrhea but do not take more than 10 tablets a day  See your doctor for follow-up  Return to ER if you have worse abdominal pain, vomiting, fever

## 2021-08-02 DIAGNOSIS — I482 Chronic atrial fibrillation, unspecified: Secondary | ICD-10-CM | POA: Diagnosis not present

## 2021-08-02 DIAGNOSIS — E785 Hyperlipidemia, unspecified: Secondary | ICD-10-CM | POA: Diagnosis not present

## 2021-08-02 DIAGNOSIS — I672 Cerebral atherosclerosis: Secondary | ICD-10-CM | POA: Diagnosis not present

## 2021-08-02 DIAGNOSIS — A084 Viral intestinal infection, unspecified: Secondary | ICD-10-CM | POA: Diagnosis not present

## 2021-08-02 DIAGNOSIS — I700 Atherosclerosis of aorta: Secondary | ICD-10-CM | POA: Diagnosis not present

## 2021-08-02 DIAGNOSIS — G720 Drug-induced myopathy: Secondary | ICD-10-CM | POA: Diagnosis not present

## 2021-08-02 DIAGNOSIS — D6869 Other thrombophilia: Secondary | ICD-10-CM | POA: Diagnosis not present

## 2021-09-05 DIAGNOSIS — R0981 Nasal congestion: Secondary | ICD-10-CM | POA: Diagnosis not present

## 2021-09-05 DIAGNOSIS — R051 Acute cough: Secondary | ICD-10-CM | POA: Diagnosis not present

## 2021-09-05 DIAGNOSIS — J0110 Acute frontal sinusitis, unspecified: Secondary | ICD-10-CM | POA: Diagnosis not present

## 2021-09-05 DIAGNOSIS — J069 Acute upper respiratory infection, unspecified: Secondary | ICD-10-CM | POA: Diagnosis not present

## 2021-09-24 ENCOUNTER — Other Ambulatory Visit: Payer: Self-pay | Admitting: Cardiology

## 2021-09-26 ENCOUNTER — Ambulatory Visit: Payer: Medicare Other | Attending: Cardiology | Admitting: Cardiology

## 2021-09-26 ENCOUNTER — Encounter: Payer: Self-pay | Admitting: Cardiology

## 2021-09-26 ENCOUNTER — Other Ambulatory Visit: Payer: Self-pay | Admitting: Cardiology

## 2021-09-26 VITALS — BP 142/72 | HR 101 | Ht 63.0 in | Wt 149.4 lb

## 2021-09-26 DIAGNOSIS — I119 Hypertensive heart disease without heart failure: Secondary | ICD-10-CM

## 2021-09-26 DIAGNOSIS — I4819 Other persistent atrial fibrillation: Secondary | ICD-10-CM | POA: Diagnosis not present

## 2021-09-26 DIAGNOSIS — Z7901 Long term (current) use of anticoagulants: Secondary | ICD-10-CM | POA: Diagnosis not present

## 2021-09-26 DIAGNOSIS — E871 Hypo-osmolality and hyponatremia: Secondary | ICD-10-CM | POA: Diagnosis not present

## 2021-09-26 DIAGNOSIS — D6869 Other thrombophilia: Secondary | ICD-10-CM | POA: Diagnosis not present

## 2021-09-26 MED ORDER — OMEPRAZOLE 20 MG PO CPDR: 20.0000 mg | DELAYED_RELEASE_CAPSULE | Freq: Every day | ORAL | 3 refills | Status: DC

## 2021-09-26 NOTE — Progress Notes (Signed)
Cardiology Office Note:    Date:  09/26/2021   ID:  Monica French, DOB [birth year], MRN [id number]  PCP:  Street, Monica Mt, MD  Cardiologist:  Shirlee More, MD    Referring MD: [address], Monica French, *    ASSESSMENT:    1. Persistent atrial fibrillation ([place])   2. Secondary hypercoagulable state ([place])   3. Chronic anticoagulation   4. Hypertensive heart disease without heart failure   5. Hyponatremia    PLAN:    In order of problems listed above:  Monica French continues to do well with her atrial fibrillation, she is rate controlled with her calcium channel blocker continue it along with her current anticoagulant. The background history of ulcer disease she will continue her PPI Hypertension is well controlled on a multidrug regimen with at bedtime Catapres ARB and her calcium channel blocker Recent serum sodium normal   Next appointment: 1 year   Medication Adjustments/Labs and Tests Ordered: Current medicines are reviewed at length with the patient today.  Concerns regarding medicines are outlined above.  No orders of the defined types were placed in this encounter.  No orders of the defined types were placed in this encounter.   Chief Complaint  Patient presents with   Medication Refill    All cardiac  meds     History of Present Illness:    Monica French is a 88 y.o. female with a hx of persistent atrial fibrillation hypertension hyperlipidemia history of gastric ulcer last seen 03/10/2021.  She was admitted to [facility] 02/10/2021 discharge 02/13/2021 with a discharge diagnosis of hyponatremia related to diuretic therapy nausea and diarrhea.  There is discussion with EP Dr. Quentin Ore who felt that her atrial fibrillation is chronic and unrelated to her CNS side effects with hyponatremia.  Her admission sodium was 122 discharge 133.  Other problems included dementia dizziness elevated bilirubin and worsening renal function in hospital with a discharge  creatinine of 0.70.  Blood pressure is difficult to control in hospital.  Her atrial fibrillation was rate controlled.  Although there was a phone discussion there was no consultation to cardiology.  An echocardiogram performed 02/11/2021 left ventricle normal in size wall thickness EF 60 to 65% right ventricle normal in size wall thickness and pulmonary artery pressure right atrium is mildly dilated left atrium moderately dilated there is mild to moderate mitral regurgitation and mild aortic regurgitation is seen.  She had an ER visit for abdominal pain 07/21/2021 her serum sodium was normal at 141 creatinine 0.9 potassium 3.7 hemoglobin 13.0 platelets 198,000.  CT of the abdomen showed no acute findings she did have diverticular disease.  Compliance with diet, lifestyle and medications: Yes  She continues to do well unaware of her atrial fibrillation tolerates her anticoagulant without bleeding.  She has a background history of ulcer disease we will continue her on a PPI I reviewed her labs from the ED with her serum sodium was normal She is not having palpitations syncope TIA chest pain edema or shortness of breath  Past Medical History:  Diagnosis Date   Anxiety    Arthritis    Gastric ulcer    Hyperlipidemia    Hypertension     Past Surgical History:  Procedure Laterality Date   ABDOMINAL HYSTERECTOMY     CHOLECYSTECTOMY     GYN surgery      Current Medications: Current Meds  Medication Sig   ALPRAZolam (XANAX) 0.25 MG tablet Take 0.25 mg by mouth 2 (two)  times daily as needed for anxiety.   apixaban (ELIQUIS) 5 MG TABS tablet TAKE 1 TABLET(5 MG) BY MOUTH TWICE DAILY (Patient taking differently: Take 5 mg by mouth 2 (two) times daily.)   Ascorbic Acid (VITAMIN C) 1000 MG tablet Take 1,000 mg by mouth daily.   benazepril (LOTENSIN) 40 MG tablet Take 40 mg by mouth every morning.   cholecalciferol (VITAMIN D3) 25 MCG (1000 UNIT) tablet Take 1,000 Units by mouth daily.    cloNIDine (CATAPRES) 0.1 MG tablet Take 0.1 mg by mouth at bedtime.   diltiazem (CARDIZEM) 30 MG tablet Take 1 tablet (30 mg total) by mouth 3 (three) times daily.   Multiple Vitamins-Minerals (CENTRUM SILVER 50+WOMEN) TABS Take 1 tablet by mouth daily.   [DISCONTINUED] dicyclomine (BENTYL) 20 MG tablet Take 1 tablet (20 mg total) by mouth 2 (two) times daily as needed for spasms.   [DISCONTINUED] omeprazole (PRILOSEC) 20 MG capsule Take 1 capsule (20 mg total) by mouth daily.     Allergies:   Codeine and Triamterene   Social History   Socioeconomic History   Marital status: Widowed    Spouse name: Not on file   Number of children: Not on file   Years of education: Not on file   Highest education level: Not on file  Occupational History   Not on file  Tobacco Use   Smoking status: Never    Passive exposure: Never   Smokeless tobacco: Never  Vaping Use   Vaping Use: Never used  Substance and Sexual Activity   Alcohol use: Never   Drug use: Never   Sexual activity: Not on file  Other Topics Concern   Not on file  Social History Narrative   Not on file   Social Determinants of Health   Financial Resource Strain: Not on file  Food Insecurity: Not on file  Transportation Needs: Not on file  Physical Activity: Not on file  Stress: Not on file  Social Connections: Not on file     Family History: The patient's family history includes Cancer - Lung in her brother; Heart attack in her father; Heart disease in her brother and mother. ROS:   Please see the history of present illness.    All other systems reviewed and are negative.  EKGs/Labs/Other Studies Reviewed:    The following studies were reviewed today:  EKG:  EKG ordered today and personally reviewed.  The ekg ordered today demonstrates atrial fibrillation controlled rate rightward axis low voltage  Recent Labs: 02/11/2021: TSH 1.195 02/13/2021: Magnesium 2.0 07/21/2021: ALT 11; BUN 11; Creatinine, Ser 0.90;  Hemoglobin 13.0; Platelets 198; Potassium 3.7; Sodium 141    Physical Exam:    VS:  BP (!) 142/72 (BP Location: Left Arm, Patient Position: Sitting)   Pulse (!) 101   Ht '5\' 3"'$  (1.6 m)   Wt 149 lb 6.4 oz (67.8 kg)   SpO2 96%   BMI 26.47 kg/m     Wt Readings from Last 3 Encounters:  09/26/21 149 lb 6.4 oz (67.8 kg)  03/10/21 148 lb (67.1 kg)  02/11/21 154 lb 5.2 oz (70 kg)     GEN:  Well nourished, well developed in no acute distress HEENT: Normal NECK: No JVD; No carotid bruits LYMPHATICS: No lymphadenopathy CARDIAC: Irregular rate and rhythm no murmurs, rubs, gallops RESPIRATORY:  Clear to auscultation without rales, wheezing or rhonchi  ABDOMEN: Soft, non-tender, non-distended MUSCULOSKELETAL:  No edema; No deformity  SKIN: Warm and dry NEUROLOGIC:  Alert and oriented  x 3 PSYCHIATRIC:  Normal affect    Signed, Shirlee More, MD  09/26/2021 10:05 AM    [place]

## 2021-09-26 NOTE — Patient Instructions (Signed)
Medication Instructions:  Your physician recommends that you continue on your current medications as directed. Please refer to the Current Medication list given to you today.  *If you need a refill on your cardiac medications before your next appointment, please call your pharmacy*   Lab Work: None If you have labs (blood work) drawn today and your tests are completely normal, you will receive your results only by: MyChart Message (if you have MyChart) OR A paper copy in the mail If you have any lab test that is abnormal or we need to change your treatment, we will call you to review the results.   Testing/Procedures: None   Follow-Up: At [facility], you and your health needs are our priority.  As part of our continuing mission to provide you with exceptional heart care, we have created designated Provider Care Teams.  These Care Teams include your primary Cardiologist (physician) and Advanced Practice Providers (APPs -  Physician Assistants and Nurse Practitioners) who all work together to provide you with the care you need, when you need it.  We recommend signing up for the patient portal called "MyChart".  Sign up information is provided on this After Visit Summary.  MyChart is used to connect with patients for Virtual Visits (Telemedicine).  Patients are able to view lab/test results, encounter notes, upcoming appointments, etc.  Non-urgent messages can be sent to your provider as well.   To learn more about what you can do with MyChart, go to [email or web address].    Your next appointment:   1 year(s)  The format for your next appointment:   In Person  Provider:   Brian Munley, MD    Other Instructions None  Important Information About Sugar       

## 2021-10-07 DIAGNOSIS — Z2821 Immunization not carried out because of patient refusal: Secondary | ICD-10-CM | POA: Diagnosis not present

## 2021-10-07 DIAGNOSIS — L501 Idiopathic urticaria: Secondary | ICD-10-CM | POA: Diagnosis not present

## 2021-10-12 ENCOUNTER — Emergency Department (HOSPITAL_COMMUNITY): Payer: Medicare Other

## 2021-10-12 ENCOUNTER — Encounter (HOSPITAL_COMMUNITY): Payer: Self-pay | Admitting: Emergency Medicine

## 2021-10-12 ENCOUNTER — Emergency Department (HOSPITAL_COMMUNITY)
Admission: EM | Admit: 2021-10-12 | Discharge: 2021-10-13 | Disposition: A | Discharge status: Home / Self Care | Payer: Medicare Other | Attending: Emergency Medicine | Admitting: Emergency Medicine

## 2021-10-12 ENCOUNTER — Other Ambulatory Visit: Payer: Self-pay

## 2021-10-12 DIAGNOSIS — R110 Nausea: Secondary | ICD-10-CM | POA: Diagnosis not present

## 2021-10-12 DIAGNOSIS — R42 Dizziness and giddiness: Secondary | ICD-10-CM | POA: Insufficient documentation

## 2021-10-12 DIAGNOSIS — R112 Nausea with vomiting, unspecified: Secondary | ICD-10-CM | POA: Diagnosis not present

## 2021-10-12 DIAGNOSIS — I6381 Other cerebral infarction due to occlusion or stenosis of small artery: Secondary | ICD-10-CM | POA: Diagnosis not present

## 2021-10-12 LAB — COMPREHENSIVE METABOLIC PANEL
ALT: 12 U/L (ref 0–44)
AST: 19 U/L (ref 15–41)
Albumin: 4.1 g/dL (ref 3.5–5.0)
Alkaline Phosphatase: 63 U/L (ref 38–126)
Anion gap: 10 (ref 5–15)
BUN: 11 mg/dL (ref 8–23)
CO2: 23 mmol/L (ref 22–32)
Calcium: 10.1 mg/dL (ref 8.9–10.3)
Chloride: 106 mmol/L (ref 98–111)
Creatinine, Ser: 0.81 mg/dL (ref 0.44–1.00)
GFR, Estimated: >60 mL/min (ref >60)
Glucose, Bld: 119 mg/dL — ABNORMAL HIGH (ref 70–99)
Potassium: 3.6 mmol/L (ref 3.5–5.1)
Sodium: 139 mmol/L (ref 135–145)
Total Bilirubin: 0.8 mg/dL (ref 0.3–1.2)
Total Protein: 7.1 g/dL (ref 6.5–8.1)

## 2021-10-12 LAB — CBC WITH DIFFERENTIAL/PLATELET
Abs Immature Granulocytes: 0.02 10*3/uL (ref 0.00–0.07)
Basophils Absolute: 0 10*3/uL (ref 0.0–0.1)
Basophils Relative: 0 %
Eosinophils Absolute: 0 10*3/uL (ref 0.0–0.5)
Eosinophils Relative: 0 %
HCT: 41.4 % (ref 36.0–46.0)
Hemoglobin: 13.6 g/dL (ref 12.0–15.0)
Immature Granulocytes: 0 %
Lymphocytes Relative: 24 %
Lymphs Abs: 1.4 10*3/uL (ref 0.7–4.0)
MCH: 29.6 pg (ref 26.0–34.0)
MCHC: 32.9 g/dL (ref 30.0–36.0)
MCV: 90 fL (ref 80.0–100.0)
Monocytes Absolute: 0.4 10*3/uL (ref 0.1–1.0)
Monocytes Relative: 7 %
Neutro Abs: 3.9 10*3/uL (ref 1.7–7.7)
Neutrophils Relative %: 69 %
Platelets: 171 10*3/uL (ref 150–400)
RBC: 4.6 MIL/uL (ref 3.87–5.11)
RDW: 14.3 % (ref 11.5–15.5)
WBC: 5.8 10*3/uL (ref 4.0–10.5)
nRBC: 0 % (ref 0.0–0.2)

## 2021-10-12 LAB — MAGNESIUM: Magnesium: 1.9 mg/dL (ref 1.7–2.4)

## 2021-10-12 LAB — TROPONIN I (HIGH SENSITIVITY): Troponin I (High Sensitivity): 7 ng/L (ref <18)

## 2021-10-12 NOTE — ED Triage Notes (Signed)
Pt reported to ED with c/o feeling dizzy and nauseated x2 days. Pt states she has hx of A-fib. Denies CP or SHOB.

## 2021-10-12 NOTE — ED Provider Triage Note (Signed)
Emergency Medicine Provider Triage Evaluation Note  Monica French , a 88 y.o. female  was evaluated in triage.  Pt complains of dizziness.  Patient reports his heart rate she had intermittent episodes of dizziness.  Patient reports that she felt weak and fatigued and her blood pressure was elevated.  Patient went to urgent care and they recommended she come to the ER for further evaluation.  Patient denies chest pain or shortness of breath.  She reports some nausea without vomiting.  No vision changes.  Patient with history of A-fib on anticoagulation  Review of Systems  Positive: Dizziness Negative: Chest pain, shortness of breath, headache, vision changes  Physical Exam  BP (!) 160/87   Pulse (!) 120   Temp 98.8 F (37.1 C) (Oral)   Resp 15   SpO2 97%  Gen:   Awake, no distress   Resp:  Normal effort  MSK:   Moves extremities without difficulty  Other:  The patient is alert, attentive, and oriented x 3. Speech is clear. Cranial nerve II-VII grossly intact. Negative pronator drift. Sensation intact. Strength 5/5 in all extremities. . Rapid alternating movement and fine finger movements intact.   Irregularly irregular rhythm lungs clear to auscultation bilaterally.  Medical Decision Making  Medically screening exam initiated at 10:00 PM.  Appropriate orders placed.  CORNELL GABER was informed that the remainder of the evaluation will be completed by another provider, this initial triage assessment does not replace that evaluation, and the importance of remaining in the ED until their evaluation is complete.  Patient presents for nonspecific dizziness.  Neuro exam is reassuring.  Patient is mildly hypertensive and tachycardic with history of A-fib.  Lab work and CT imaging is pending at this time.   Doristine Devoid, PA-C 10/12/21 2201

## 2021-10-13 ENCOUNTER — Emergency Department (HOSPITAL_COMMUNITY): Payer: Medicare Other

## 2021-10-13 DIAGNOSIS — R42 Dizziness and giddiness: Secondary | ICD-10-CM | POA: Diagnosis not present

## 2021-10-13 LAB — URINALYSIS, ROUTINE W REFLEX MICROSCOPIC
Bacteria, UA: NONE SEEN
Bilirubin Urine: NEGATIVE
Glucose, UA: NEGATIVE mg/dL
Ketones, ur: 5 mg/dL — AB
Nitrite: NEGATIVE
Protein, ur: NEGATIVE mg/dL
Specific Gravity, Urine: 1.009 (ref 1.005–1.030)
pH: 6 (ref 5.0–8.0)

## 2021-10-13 LAB — TROPONIN I (HIGH SENSITIVITY): Troponin I (High Sensitivity): 7 ng/L (ref <18)

## 2021-10-13 MED ORDER — SODIUM CHLORIDE 0.9 % IV BOLUS
500.0000 mL | Freq: Once | INTRAVENOUS | Status: AC
  Administered 2021-10-13: 500 mL via INTRAVENOUS

## 2021-10-13 MED ORDER — MECLIZINE HCL 12.5 MG PO TABS: 12.5000 mg | ORAL_TABLET | Freq: Three times a day (TID) | ORAL | 0 refills | Status: AC | PRN

## 2021-10-13 MED ORDER — DILTIAZEM HCL 30 MG PO TABS
30.0000 mg | ORAL_TABLET | Freq: Once | ORAL | Status: AC
  Administered 2021-10-13: 30 mg via ORAL
  Filled 2021-10-13: qty 1

## 2021-10-13 MED ORDER — ONDANSETRON 4 MG PO TBDP: ORAL_TABLET | ORAL | 0 refills | Status: AC

## 2021-10-13 NOTE — ED Notes (Signed)
ED Provider at bedside. 

## 2021-10-13 NOTE — ED Provider Notes (Signed)
[facility] EMERGENCY DEPARTMENT Provider Note   CSN: [id number] Arrival date & time: 10/12/21  1958     History  Chief Complaint  Patient presents with   Dizziness    Monica French is a 88 y.o. female.  Presents to the emergency department with complaints of nausea, poor oral intake, dizziness for 2 days.  Patient also reports that the left side of her face feels "different".  Patient reports that she had Bell's palsy years ago and it feels similar.  She has had intermittent episodes of this sensation over the years.  Patient denies abdominal pain, vomiting, diarrhea.  Patient reports that she has been using Dramamine at home and it has helped with the dizziness and her nausea.       Home Medications Prior to Admission medications   Medication Sig Start Date End Date Taking? Authorizing Provider  meclizine (ANTIVERT) 12.5 MG tablet Take 1 tablet (12.5 mg total) by mouth 3 (three) times daily as needed for dizziness. 10/13/21  Yes Granville Whitefield, Gwenyth Allegra, MD  ondansetron (ZOFRAN-ODT) 4 MG disintegrating tablet '4mg'$  ODT q4 hours prn nausea/vomit 10/13/21  Yes Armon Orvis, Gwenyth Allegra, MD  ALPRAZolam Duanne Moron) 0.25 MG tablet Take 0.25 mg by mouth 2 (two) times daily as needed for anxiety. 06/11/20   [provider]  apixaban (ELIQUIS) 5 MG TABS tablet TAKE 1 TABLET(5 MG) BY MOUTH TWICE DAILY Patient taking differently: Take 5 mg by mouth 2 (two) times daily. 05/09/21   Richardo Priest, MD  Ascorbic Acid (VITAMIN C) 1000 MG tablet Take 1,000 mg by mouth daily.    [provider]  benazepril (LOTENSIN) 40 MG tablet Take 40 mg by mouth every morning. 06/11/20   [provider]  cholecalciferol (VITAMIN D3) 25 MCG (1000 UNIT) tablet Take 1,000 Units by mouth daily.    [provider]  cloNIDine (CATAPRES) 0.1 MG tablet Take 0.1 mg by mouth at bedtime. 02/08/21   [provider]  diltiazem (CARDIZEM) 30 MG tablet TAKE 1 TABLET(30 MG)  BY MOUTH THREE TIMES DAILY 09/26/21   Richardo Priest, MD  Multiple Vitamins-Minerals (CENTRUM SILVER 50+WOMEN) TABS Take 1 tablet by mouth daily.    [provider]  omeprazole (PRILOSEC) 20 MG capsule Take 1 capsule (20 mg total) by mouth daily. 09/26/21   Richardo Priest, MD      Allergies    Codeine and Triamterene    Review of Systems   Review of Systems  Physical Exam Updated Vital Signs BP (!) 150/101   Pulse (!) 103   Temp 98.8 F (37.1 C) (Oral)   Resp 18   SpO2 99%  Physical Exam Vitals and nursing note reviewed.  Constitutional:      General: She is not in acute distress.    Appearance: She is well-developed.  HENT:     Head: Normocephalic and atraumatic.     Mouth/Throat:     Mouth: Mucous membranes are moist.  Eyes:     General: Vision grossly intact. Gaze aligned appropriately.     Extraocular Movements: Extraocular movements intact.     Conjunctiva/sclera: Conjunctivae normal.  Cardiovascular:     Rate and Rhythm: Tachycardia present. Rhythm irregular.     Pulses: Normal pulses.     Heart sounds: Normal heart sounds, S1 normal and S2 normal. No murmur heard.    No friction rub. No gallop.  Pulmonary:     Effort: Pulmonary effort is normal. No respiratory distress.  Breath sounds: Normal breath sounds.  Abdominal:     General: Bowel sounds are normal.     Palpations: Abdomen is soft.     Tenderness: There is no abdominal tenderness. There is no guarding or rebound.     Hernia: No hernia is present.  Musculoskeletal:        General: No swelling.     Cervical back: Full passive range of motion without pain, normal range of motion and neck supple. No spinous process tenderness or muscular tenderness. Normal range of motion.     Right lower leg: No edema.     Left lower leg: No edema.  Skin:    General: Skin is warm and dry.     Capillary Refill: Capillary refill takes less than 2 seconds.     Findings: No ecchymosis, erythema, rash or wound.   Neurological:     General: No focal deficit present.     Mental Status: She is alert and oriented to person, place, and time.     GCS: GCS eye subscore is 4. GCS verbal subscore is 5. GCS motor subscore is 6.     Cranial Nerves: Cranial nerves 2-12 are intact.     Sensory: Sensation is intact.     Motor: Motor function is intact.     Coordination: Coordination is intact.     Comments: Extraocular muscle movement: normal No visual field cut Pupils: equal and reactive both direct and consensual response is normal No nystagmus present    Sensory function is intact to light touch, pinprick Proprioception intact  Grip strength 5/5 symmetric in upper extremities No pronator drift Normal finger to nose bilaterally  Lower extremity strength 5/5 against gravity      Psychiatric:        Attention and Perception: Attention normal.        Mood and Affect: Mood normal.        Speech: Speech normal.        Behavior: Behavior normal.     ED Results / Procedures / Treatments   Labs (all labs ordered are listed, but only abnormal results are displayed) Labs Reviewed  COMPREHENSIVE METABOLIC PANEL - Abnormal; Notable for the following components:      Result Value   Glucose, Bld 119 (*)    All other components within normal limits  URINALYSIS, ROUTINE W REFLEX MICROSCOPIC - Abnormal; Notable for the following components:   Hgb urine dipstick MODERATE (*)    Ketones, ur 5 (*)    Leukocytes,Ua SMALL (*)    All other components within normal limits  CBC WITH DIFFERENTIAL/PLATELET  MAGNESIUM  TROPONIN I (HIGH SENSITIVITY)  TROPONIN I (HIGH SENSITIVITY)    EKG EKG Interpretation  Date/Time:  Wednesday October 12 2021 22:12:44 EDT Ventricular Rate:  117 PR Interval:    QRS Duration: 84 QT Interval:  322 QTC Calculation: 449 R Axis:   119 Text Interpretation: Atrial fibrillation with rapid ventricular response Right axis deviation Nonspecific ST abnormality Abnormal ECG When  compared with ECG of 10-Feb-2021 17:41, Although rate has increased Confirmed by Orpah Greek [phone]) on 10/13/2021 12:47:08 AM  Radiology MR BRAIN WO CONTRAST  Result Date: 10/13/2021 CLINICAL DATA:  Dizziness EXAM: MRI HEAD WITHOUT CONTRAST TECHNIQUE: Multiplanar, multiecho pulse sequences of the brain and surrounding structures were obtained without intravenous contrast. COMPARISON:  None Available. FINDINGS: Brain: No acute infarct, mass effect or extra-axial collection. No acute or chronic hemorrhage. There is multifocal hyperintense T2-weighted signal within the white matter.  Parenchymal volume and CSF spaces are normal. The midline structures are normal. Vascular: Major flow voids are preserved. Skull and upper cervical spine: Normal calvarium and skull base. Visualized upper cervical spine and soft tissues are normal. Sinuses/Orbits:No paranasal sinus fluid levels or advanced mucosal thickening. No mastoid or middle ear effusion. Normal orbits. IMPRESSION: 1. No acute intracranial abnormality. 2. Findings of chronic small vessel ischemia. Electronically Signed   By: Ulyses Jarred M.D.   On: 10/13/2021 03:24   CT Head Wo Contrast  Result Date: 10/12/2021 CLINICAL DATA:  Dizziness, persistent or recurrent, cardiac or vascular cause suspected. Patient feels dizzy and nausea for 2 days. EXAM: CT HEAD WITHOUT CONTRAST TECHNIQUE: Contiguous axial images were obtained from the base of the skull through the vertex without intravenous contrast. RADIATION DOSE REDUCTION: This exam was performed according to the departmental dose-optimization program which includes automated exposure control, adjustment of the mA and/or kV according to patient size and/or use of iterative reconstruction technique. COMPARISON:  CT 02/10/2021.  MRI 02/12/2021 FINDINGS: Brain: Diffuse cerebral atrophy. No ventricular dilatation. Low-attenuation changes in the deep white matter consistent small vessel ischemia. No  mass-effect or midline shift. No abnormal extra-axial fluid collections. Gray-white matter junctions are distinct. Old lacunar infarcts in the right basal ganglia. No acute intracranial hemorrhage. Vascular: Intracranial arterial vascular calcifications. Skull: Normal. Negative for fracture or focal lesion. Sinuses/Orbits: No acute finding. Other: None. IMPRESSION: No acute intracranial abnormalities. Chronic atrophy and small vessel ischemic changes. Old lacunar infarcts in the right basal ganglia. No change since prior studies. Electronically Signed   By: Lucienne Capers M.D.   On: 10/12/2021 23:34    Procedures Procedures    Medications Ordered in ED Medications  diltiazem (CARDIZEM) tablet 30 mg (30 mg Oral Given 10/13/21 0131)  sodium chloride 0.9 % bolus 500 mL (0 mLs Intravenous Stopped 10/13/21 0133)    ED Course/ Medical Decision Making/ A&P Clinical Course as of 10/13/21 0348  Thu Oct 13, 2021  0220 CT Head Wo Contrast [DW]    Clinical Course User Index [DW] Lise Auer                           Medical Decision Making Amount and/or Complexity of Data Reviewed Radiology: ordered.  Risk Prescription drug management.   Patient presents to the emergency department with multiple complaints.  Patient reports that she has been feeling nauseated for couple of days.  No associated vomiting or other GI symptoms.  This has caused her to not eat or drink very much.  She has had sensation of dizziness and also vague left facial symptoms that she cannot describe.  She does report a distant history of Bell's palsy and thought maybe it was coming back.  Visually there is no asymmetry.  Neurologic exam is completely normal.  Patient's work-up is unrevealing.  She was hypertensive and tachycardic at arrival.  Patient is in chronic A-fib.  She takes short acting Cardizem at home for rate control.  She was given an additional dose and blood pressure and heart rate  improved.  Patient is on Eliquis for stroke prevention.  An MRI was performed to further evaluate her neurologic symptoms.  No new stroke is seen.  As her work-up has been entirely unremarkable, it is felt that she does not require hospitalization.  We will treat nausea with Zofran, dizziness with meclizine, encourage patient to follow-up with PCP as soon as possible.  Final Clinical Impression(s) / ED Diagnoses Final diagnoses:  Dizziness  Nausea    Rx / DC Orders ED Discharge Orders          Ordered    ondansetron (ZOFRAN-ODT) 4 MG disintegrating tablet        10/13/21 0348    meclizine (ANTIVERT) 12.5 MG tablet  3 times daily PRN        10/13/21 0348              Orpah Greek, MD 10/13/21 [phone]

## 2021-10-13 NOTE — ED Notes (Signed)
Patient transported to MRI 

## 2021-10-17 DIAGNOSIS — J209 Acute bronchitis, unspecified: Secondary | ICD-10-CM | POA: Diagnosis not present

## 2021-10-17 DIAGNOSIS — R051 Acute cough: Secondary | ICD-10-CM | POA: Diagnosis not present

## 2021-10-17 DIAGNOSIS — R062 Wheezing: Secondary | ICD-10-CM | POA: Diagnosis not present

## 2021-11-20 DIAGNOSIS — I10 Essential (primary) hypertension: Secondary | ICD-10-CM | POA: Diagnosis not present

## 2021-11-20 DIAGNOSIS — J069 Acute upper respiratory infection, unspecified: Secondary | ICD-10-CM | POA: Diagnosis not present

## 2021-11-20 DIAGNOSIS — R059 Cough, unspecified: Secondary | ICD-10-CM | POA: Diagnosis not present

## 2022-02-07 DIAGNOSIS — R42 Dizziness and giddiness: Secondary | ICD-10-CM | POA: Diagnosis not present

## 2022-02-07 DIAGNOSIS — R0781 Pleurodynia: Secondary | ICD-10-CM | POA: Diagnosis not present

## 2022-03-07 DIAGNOSIS — M79671 Pain in right foot: Secondary | ICD-10-CM | POA: Diagnosis not present

## 2022-03-11 ENCOUNTER — Emergency Department (HOSPITAL_COMMUNITY): Payer: Medicare Other

## 2022-03-11 ENCOUNTER — Encounter (HOSPITAL_COMMUNITY): Payer: Self-pay

## 2022-03-11 ENCOUNTER — Emergency Department (HOSPITAL_COMMUNITY)
Admission: EM | Admit: 2022-03-11 | Discharge: 2022-03-11 | Disposition: A | Discharge status: Home / Self Care | Payer: Medicare Other | Attending: Emergency Medicine | Admitting: Emergency Medicine

## 2022-03-11 DIAGNOSIS — S96911A Strain of unspecified muscle and tendon at ankle and foot level, right foot, initial encounter: Secondary | ICD-10-CM | POA: Insufficient documentation

## 2022-03-11 DIAGNOSIS — M5431 Sciatica, right side: Secondary | ICD-10-CM | POA: Diagnosis not present

## 2022-03-11 DIAGNOSIS — M25551 Pain in right hip: Secondary | ICD-10-CM | POA: Insufficient documentation

## 2022-03-11 DIAGNOSIS — S76011A Strain of muscle, fascia and tendon of right hip, initial encounter: Secondary | ICD-10-CM | POA: Diagnosis not present

## 2022-03-11 DIAGNOSIS — X509XXA Other and unspecified overexertion or strenuous movements or postures, initial encounter: Secondary | ICD-10-CM | POA: Diagnosis not present

## 2022-03-11 DIAGNOSIS — S99921A Unspecified injury of right foot, initial encounter: Secondary | ICD-10-CM | POA: Diagnosis present

## 2022-03-11 DIAGNOSIS — R21 Rash and other nonspecific skin eruption: Secondary | ICD-10-CM | POA: Insufficient documentation

## 2022-03-11 DIAGNOSIS — R233 Spontaneous ecchymoses: Secondary | ICD-10-CM

## 2022-03-11 DIAGNOSIS — Z7901 Long term (current) use of anticoagulants: Secondary | ICD-10-CM | POA: Insufficient documentation

## 2022-03-11 LAB — CBC WITH DIFFERENTIAL/PLATELET
Abs Immature Granulocytes: 0.04 10*3/uL (ref 0.00–0.07)
Basophils Absolute: 0 10*3/uL (ref 0.0–0.1)
Basophils Relative: 0 %
Eosinophils Absolute: 0 10*3/uL (ref 0.0–0.5)
Eosinophils Relative: 1 %
HCT: 41.2 % (ref 36.0–46.0)
Hemoglobin: 13.4 g/dL (ref 12.0–15.0)
Immature Granulocytes: 1 %
Lymphocytes Relative: 27 %
Lymphs Abs: 1.9 10*3/uL (ref 0.7–4.0)
MCH: 31 pg (ref 26.0–34.0)
MCHC: 32.5 g/dL (ref 30.0–36.0)
MCV: 95.4 fL (ref 80.0–100.0)
Monocytes Absolute: 0.4 10*3/uL (ref 0.1–1.0)
Monocytes Relative: 6 %
Neutro Abs: 4.7 10*3/uL (ref 1.7–7.7)
Neutrophils Relative %: 65 %
Platelets: 223 10*3/uL (ref 150–400)
RBC: 4.32 MIL/uL (ref 3.87–5.11)
RDW: 12.9 % (ref 11.5–15.5)
WBC: 7.1 10*3/uL (ref 4.0–10.5)
nRBC: 0 % (ref 0.0–0.2)

## 2022-03-11 MED ORDER — ACETAMINOPHEN 500 MG PO TABS
1000.0000 mg | ORAL_TABLET | Freq: Once | ORAL | Status: AC
Start: 2022-03-11 — End: 2022-03-11
  Administered 2022-03-11: 1000 mg via ORAL
  Filled 2022-03-11: qty 2

## 2022-03-11 NOTE — ED Notes (Signed)
Pt verbalized understanding of discharge instructions. Opportunity for questions provided.  

## 2022-03-11 NOTE — ED Notes (Signed)
EDP at bedside  

## 2022-03-11 NOTE — ED Triage Notes (Signed)
Pt to ED c/o right foot pain and right hip pain x 1 week evaluated for the same 1 week ago, and was told xray was negative. Reports no relief with prescribed lidocaine patch. No known injury

## 2022-03-11 NOTE — ED Provider Notes (Signed)
Monica French   CSN: [id number] Arrival date & time: 03/11/22  1422     History  Chief Complaint  Patient presents with   Foot Pain    right   Hip Pain    right    Monica French is a 89 y.o. female.  Patient presents with right foot pain and right hip pain from his 1 week.  Parent and son do recall she has a new recliner she has been using her legs to adjust the incline or decline.  Patient had screening foot x-ray done at urgent care and it was negative was prescribed lidocaine patch has not helped.  No weakness.  No fevers or chills.  No direct trauma.  Worse with range of motion.  Patient also noticed a new rash on the right anterior leg.  No injuries recalled the patient is on Eliquis.       Home Medications Prior to Admission medications   Medication Sig Start Date End Date Taking? Authorizing Provider  ALPRAZolam Duanne Moron) 0.25 MG tablet Take 0.25 mg by mouth daily as needed for anxiety. 06/11/20   [provider]  apixaban (ELIQUIS) 5 MG TABS tablet TAKE 1 TABLET(5 MG) BY MOUTH TWICE DAILY Patient taking differently: Take 5 mg by mouth 2 (two) times daily. 05/09/21   Richardo Priest, MD  Ascorbic Acid (VITAMIN C) 1000 MG tablet Take 1,000 mg by mouth daily.    [provider]  benazepril (LOTENSIN) 40 MG tablet Take 40 mg by mouth every morning. 06/11/20   [provider]  cholecalciferol (VITAMIN D3) 25 MCG (1000 UNIT) tablet Take 1,000 Units by mouth daily.    [provider]  cloNIDine (CATAPRES) 0.1 MG tablet Take 0.1 mg by mouth at bedtime. 02/08/21   [provider]  diltiazem (CARDIZEM) 30 MG tablet TAKE 1 TABLET(30 MG) BY MOUTH THREE TIMES DAILY Patient taking differently: Take 30 mg by mouth 3 (three) times daily. 09/26/21   Richardo Priest, MD  meclizine (ANTIVERT) 12.5 MG tablet Take 1 tablet (12.5 mg total) by mouth 3 (three) times daily as needed for dizziness.  10/13/21   Orpah Greek, MD  Multiple Vitamins-Minerals (CENTRUM SILVER 50+WOMEN) TABS Take 1 tablet by mouth daily.    [provider]  omeprazole (PRILOSEC) 20 MG capsule Take 1 capsule (20 mg total) by mouth daily. 09/26/21   Richardo Priest, MD  ondansetron (ZOFRAN-ODT) 4 MG disintegrating tablet '4mg'$  ODT q4 hours prn nausea/vomit 10/13/21   Pollina, Gwenyth Allegra, MD      Allergies    Codeine and Triamterene    Review of Systems   Review of Systems  Constitutional:  Negative for chills and fever.  HENT:  Negative for congestion.   Eyes:  Negative for visual disturbance.  Respiratory:  Negative for shortness of breath.   Cardiovascular:  Negative for chest pain.  Gastrointestinal:  Negative for abdominal pain and vomiting.  Genitourinary:  Negative for dysuria and flank pain.  Musculoskeletal:  Negative for back pain, joint swelling, neck pain and neck stiffness.  Skin:  Positive for rash.  Neurological:  Negative for light-headedness and headaches.    Physical Exam Updated Vital Signs BP 136/88 (BP Location: Right Arm)   Pulse 94   Temp 98.1 F (36.7 C)   Resp 15   Ht '5\' 3"'$  (1.6 m)   Wt 65.8 kg   SpO2 98%   BMI 25.69 kg/m  Physical  Exam Vitals and nursing French reviewed.  Constitutional:      General: She is not in acute distress.    Appearance: She is well-developed.  HENT:     Head: Normocephalic and atraumatic.     Mouth/Throat:     Mouth: Mucous membranes are moist.  Eyes:     General:        Right eye: No discharge.        Left eye: No discharge.     Conjunctiva/sclera: Conjunctivae normal.  Neck:     Trachea: No tracheal deviation.  Cardiovascular:     Rate and Rhythm: Normal rate.  Pulmonary:     Effort: Pulmonary effort is normal.  Abdominal:     General: There is no distension.     Palpations: Abdomen is soft.     Tenderness: There is no abdominal tenderness. There is no guarding.  Musculoskeletal:        General: Tenderness  present. No swelling.     Cervical back: Normal range of motion and neck supple. No rigidity.  Skin:    General: Skin is warm.     Capillary Refill: Capillary refill takes less than 2 seconds.     Findings: Rash present.     Comments: Patient has a few petechia and superficial hemorrhages right anterior leg.  Nontender.  No warmth or cellulitis.  2+ distal pulses right lower extremity.  No bony tenderness appreciated.  Patient has mild tenderness with flexion of the knee which is chronic per her report.  Patient has mild tenderness with compression of the right hip and external rotation.  Neurological:     General: No focal deficit present.     Mental Status: She is alert.     Cranial Nerves: No cranial nerve deficit.     Comments: Patient has normal strength flexion extension of lower extremities of each major joint.  Sensation intact to palpation.  Psychiatric:        Mood and Affect: Mood normal.     ED Results / Procedures / Treatments   Labs (all labs ordered are listed, but only abnormal results are displayed) Labs Reviewed  CBC WITH DIFFERENTIAL/PLATELET    EKG None  Radiology No results found.  Procedures Procedures    Medications Ordered in ED Medications  acetaminophen (TYLENOL) tablet 1,000 mg (has no administration in time range)    ED Course/ Medical Decision Making/ A&P                             Medical Decision Making Amount and/or Complexity of Data Reviewed Labs: ordered. Radiology: ordered.  Risk OTC drugs.   Patient presents with new rash likely from patient being on anticoagulant, plan to check CBC to check platelet levels.  Discussed supportive care for this nontender no sign of infection.  Patient also presents with clinical concern for sciatica with right lateral hip discomfort going down lateral and posterior thigh and intermittent radiation down the leg.  No signs of acute ischemic leg.  No signs of infection.  Discussed screening x-ray  of the right hip as arthritis also on the differential.  No concern for septic joint.  Discussed outpatient follow-up with primary doctor as patient may need physical therapy if no improvement.  Tylenol ordered for pain.  Blood work independently reviewed CBC normal no signs of thrombocytopenia or anemia.  Patient's pain controlled.  X-ray reviewed no acute fracture or significant arthritis.  Patient  stable for discharge.        Final Clinical Impression(s) / ED Diagnoses Final diagnoses:  Strain of right foot, initial encounter  Acute hip pain, right  Sciatica of right side  Petechial rash    Rx / DC Orders ED Discharge Orders     None         Elnora Morrison, MD 03/11/22 1731

## 2022-03-11 NOTE — Discharge Instructions (Addendum)
Your hip x-ray was normal. Your CBC was also normal. Use Tylenol every 4 hours and follow-up with your doctor for further treatment options and possible physical therapy for sciatica. Return for new concerns such as weakness or numbness in your arm or leg, persistent fevers, unable to urinate or incontinence.

## 2022-03-22 ENCOUNTER — Telehealth: Payer: Self-pay | Admitting: Cardiology

## 2022-03-22 MED ORDER — OMEPRAZOLE 20 MG PO CPDR: 20.0000 mg | DELAYED_RELEASE_CAPSULE | Freq: Every day | ORAL | 3 refills | Status: DC

## 2022-03-22 NOTE — Telephone Encounter (Signed)
*  STAT* If patient is at the pharmacy, call can be transferred to refill team.   1. Which medications need to be refilled? (please list name of each medication and dose if known) omeprazole (PRILOSEC) 20 MG capsule   2. Which pharmacy/location (including street and city if local pharmacy) is medication to be sent to? [facility] [phone] - [place], [address] AT [address]   3. Do they need a 30 day or 90 day supply? [place]

## 2022-03-22 NOTE — Telephone Encounter (Signed)
Medication sent.

## 2022-03-22 NOTE — Addendum Note (Signed)
Addended by: Darrel Reach on: 03/22/2022 09:23 AM   Modules accepted: Orders

## 2022-03-23 DIAGNOSIS — Z0000 Encounter for general adult medical examination without abnormal findings: Secondary | ICD-10-CM | POA: Diagnosis not present

## 2022-03-23 DIAGNOSIS — I672 Cerebral atherosclerosis: Secondary | ICD-10-CM | POA: Diagnosis not present

## 2022-03-23 DIAGNOSIS — I83209 Varicose veins of unspecified lower extremity with both ulcer of unspecified site and inflammation: Secondary | ICD-10-CM | POA: Diagnosis not present

## 2022-03-23 DIAGNOSIS — I700 Atherosclerosis of aorta: Secondary | ICD-10-CM | POA: Diagnosis not present

## 2022-03-23 DIAGNOSIS — E785 Hyperlipidemia, unspecified: Secondary | ICD-10-CM | POA: Diagnosis not present

## 2022-03-23 DIAGNOSIS — I10 Essential (primary) hypertension: Secondary | ICD-10-CM | POA: Diagnosis not present

## 2022-03-23 DIAGNOSIS — Z79899 Other long term (current) drug therapy: Secondary | ICD-10-CM | POA: Diagnosis not present

## 2022-03-23 DIAGNOSIS — M8589 Other specified disorders of bone density and structure, multiple sites: Secondary | ICD-10-CM | POA: Diagnosis not present

## 2022-03-23 DIAGNOSIS — I482 Chronic atrial fibrillation, unspecified: Secondary | ICD-10-CM | POA: Diagnosis not present

## 2022-03-23 DIAGNOSIS — L97909 Non-pressure chronic ulcer of unspecified part of unspecified lower leg with unspecified severity: Secondary | ICD-10-CM | POA: Diagnosis not present

## 2022-03-23 DIAGNOSIS — D6869 Other thrombophilia: Secondary | ICD-10-CM | POA: Diagnosis not present

## 2022-03-23 DIAGNOSIS — G720 Drug-induced myopathy: Secondary | ICD-10-CM | POA: Diagnosis not present

## 2022-03-23 DIAGNOSIS — M8580 Other specified disorders of bone density and structure, unspecified site: Secondary | ICD-10-CM | POA: Diagnosis not present

## 2022-04-27 DIAGNOSIS — H43812 Vitreous degeneration, left eye: Secondary | ICD-10-CM | POA: Diagnosis not present

## 2022-05-09 ENCOUNTER — Other Ambulatory Visit: Payer: Self-pay

## 2022-05-09 ENCOUNTER — Telehealth: Payer: Self-pay | Admitting: Cardiology

## 2022-05-09 DIAGNOSIS — D6869 Other thrombophilia: Secondary | ICD-10-CM | POA: Diagnosis not present

## 2022-05-09 NOTE — Telephone Encounter (Signed)
Follow Up: ? ? ? ? ?Patient is calling back. ?

## 2022-05-09 NOTE — Telephone Encounter (Signed)
Called patient and she reported that she has discoloration in her lower extremities. She is not a diabetic and she has no loss of sensation in her lower extremities. Today she woke up and had 3 quarter size area blood spots on the back of 1 of her legs. She states that she just started wearing support hoes.

## 2022-05-09 NOTE — Telephone Encounter (Signed)
Calling to in with concerns with some spotting in her legs and discoloration. Please advise  Schedule patient for 5/17

## 2022-05-09 NOTE — Telephone Encounter (Signed)
Called the patient and informed her of Wallis Bamberg, NP's recommendation below:  "Have her come in for labs only and lets check a CBC please."  Patient verbalized understanding and had no further questions at this time.

## 2022-05-10 ENCOUNTER — Telehealth: Payer: Self-pay | Admitting: Cardiology

## 2022-05-10 ENCOUNTER — Other Ambulatory Visit: Payer: Self-pay

## 2022-05-10 DIAGNOSIS — I480 Paroxysmal atrial fibrillation: Secondary | ICD-10-CM

## 2022-05-10 LAB — CBC
Hematocrit: 40.3 % (ref 34.0–46.6)
Hemoglobin: 13.6 g/dL (ref 11.1–15.9)
MCH: 30.6 pg (ref 26.6–33.0)
MCHC: 33.7 g/dL (ref 31.5–35.7)
MCV: 91 fL (ref 79–97)
Platelets: 221 x10E3/uL (ref 150–450)
RBC: 4.44 x10E6/uL (ref 3.77–5.28)
RDW: 11.9 % (ref 11.7–15.4)
WBC: 5.5 x10E3/uL (ref 3.4–10.8)

## 2022-05-10 MED ORDER — APIXABAN 5 MG PO TABS: 5.0000 mg | ORAL_TABLET | Freq: Two times a day (BID) | ORAL | 5 refills | Status: DC

## 2022-05-10 NOTE — Telephone Encounter (Signed)
Prescription refill request for Eliquis received. Indication: Afib  Last office visit: 09/26/21 [facility])  Scr: 0.81 (10/12/21)  Age: 89 Weight: 65.8kg  Appropriate dose. Refill sent.

## 2022-05-10 NOTE — Telephone Encounter (Signed)
Patient need refill on Eliquis sent to [facility] on [address] in [place].

## 2022-05-18 ENCOUNTER — Telehealth: Payer: Self-pay | Admitting: Cardiology

## 2022-05-18 NOTE — Telephone Encounter (Signed)
Patient is requesting call back to discuss using a Nooro Knee Massager to help with knee pain and was told to ask cardiologist if there are heart issues. Please advise.

## 2022-05-19 NOTE — Telephone Encounter (Signed)
Patient called and asked if it was ok for her to use a Nooro Knee Masager to help with her knee pain. She was reading about it online and it said if you have cardiac issues to consult your cardiologist before using. Patient stated that she has A-fib.

## 2022-05-19 NOTE — Telephone Encounter (Signed)
Informed patient of Wallis Bamberg, NP's recommendation below:  "We are not aware of any issues with this and her AF."  Patient was appreciative for the call and had no further questions at this time.

## 2022-06-22 DIAGNOSIS — H25813 Combined forms of age-related cataract, bilateral: Secondary | ICD-10-CM | POA: Diagnosis not present

## 2022-07-02 NOTE — Progress Notes (Unsigned)
Cardiology Office Note:  .   Date:  07/03/2022  ID:  Monica French, DOB 03-26-1931, MRN [id number] PCP: Street, Stephanie Coup, MD  [facility] Providers Cardiologist:  None    History of Present Illness: Monica French is a 89 y.o. female with a past medical history of persistent atrial fibrillation CHA2DS2-VASc score of 4, hypertension, hyponatremia, PUD, dementia, dizziness, hemosiderin.  In 2022 she was found to be in atrial fibrillation after presenting for knee surgery, incidental finding, she was asymptomatic.  She declined DCCV or further attempts at getting her back into sinus rhythm.  02/11/2021 echocardiogram EF 66 5%, RA mildly dilated, LA moderately dilated, moderate MR, mild AR 10/14/2020 monitor revealed she was continuously in atrial fibrillation, heart rate ranging from 42 to 174 bpm with average of 82 bpm  Admitted to [facility] in February 2023 discharged with a diagnosis of hyponatremia related to diuretic therapy.  Most recently evaluated by Dr. Dulce Sellar on 09/26/2021, at this time she was doing well from a cardiac perspective, no changes were made to her medications or French of care and she was advised to follow-up in a year.  Previous antiarrhythmic drugs: none Previous cardioversions: none Previous ablations: none CHADS2VASC score: 4 Anticoagulation history: none  She presents today for follow-up of concerns of discoloration of her lower legs.  This has been persistent and ongoing for at least the last 2 years.  She was instructed to try an OTC cream by her PCP but noticed this did not lighten the discoloration.  She does not offer further complaints today.  She does when she was able to get out work in her garden more however with her bilateral knee pain this has been very difficult for her today.  She denies hematochezia, hematuria, hemoptysis. She denies chest pain, palpitations, dyspnea, pnd, orthopnea, n, v, dizziness, syncope, edema, weight gain, or  early satiety.    ROS: Review of Systems  Constitutional: Negative.   HENT: Negative.    Eyes: Negative.   Respiratory: Negative.    Cardiovascular: Negative.   Gastrointestinal: Negative.   Genitourinary: Negative.   Musculoskeletal:  Positive for joint pain.  Skin:        Hemosiderin  Neurological:  Positive for dizziness.  Endo/Heme/Allergies:  Bruises/bleeds easily.  Psychiatric/Behavioral: Negative.       Studies Reviewed: .        Cardiac Studies & Procedures       ECHOCARDIOGRAM  ECHOCARDIOGRAM COMPLETE 02/11/2021  Narrative ECHOCARDIOGRAM REPORT    Patient Name:   Monica French Date of Exam: 02/11/2021 Medical Rec #:  [id number]        Height:       63.0 in Accession #:    [id number]       Weight:       154.3 lb Date of Birth:  1931-08-05        BSA:          1.732 m Patient Age:    89 years         BP:           123/57 mmHg Patient Gender: F                HR:           86 bpm. Exam Location:  Inpatient  Procedure: 2D Echo  Indications:    atrial fibrillation  History:        Patient has prior history of  Echocardiogram examinations, most recent 09/24/2020. Arrythmias:Atrial Fibrillation; Risk Factors:Hypertension.  Sonographer:    Delcie Roch RDCS Referring Phys: [id number] CHING T TU  IMPRESSIONS   1. Left ventricular ejection fraction, by estimation, is 60 to 65%. The left ventricle has normal function. The left ventricle has no regional wall motion abnormalities. Left ventricular diastolic function could not be evaluated. 2. Right ventricular systolic function is normal. The right ventricular size is normal. There is normal pulmonary artery systolic pressure. 3. Left atrial size was moderately dilated. 4. Right atrial size was mildly dilated. 5. The mitral valve is normal in structure. Mild to moderate mitral valve regurgitation. No evidence of mitral stenosis. 6. The aortic valve is normal in structure. Aortic valve regurgitation is mild.  No aortic stenosis is present. 7. The inferior vena cava is normal in size with greater than 50% respiratory variability, suggesting right atrial pressure of 3 mmHg.  Comparison(s): No significant change from prior study. Prior images reviewed side by side.  FINDINGS Left Ventricle: Left ventricular ejection fraction, by estimation, is 60 to 65%. The left ventricle has normal function. The left ventricle has no regional wall motion abnormalities. The left ventricular internal cavity size was normal in size. There is no left ventricular hypertrophy. Left ventricular diastolic function could not be evaluated due to atrial fibrillation. Left ventricular diastolic function could not be evaluated.  Right Ventricle: The right ventricular size is normal. No increase in right ventricular wall thickness. Right ventricular systolic function is normal. There is normal pulmonary artery systolic pressure. The tricuspid regurgitant velocity is 2.72 m/s, and with an assumed right atrial pressure of 3 mmHg, the estimated right ventricular systolic pressure is 32.6 mmHg.  Left Atrium: Left atrial size was moderately dilated.  Right Atrium: Right atrial size was mildly dilated.  Pericardium: There is no evidence of pericardial effusion.  Mitral Valve: The mitral valve is normal in structure. Mild to moderate mitral valve regurgitation, with posteriorly-directed jet. No evidence of mitral valve stenosis.  Tricuspid Valve: The tricuspid valve is normal in structure. Tricuspid valve regurgitation is mild . No evidence of tricuspid stenosis.  Aortic Valve: The aortic valve is normal in structure. Aortic valve regurgitation is mild. No aortic stenosis is present.  Pulmonic Valve: The pulmonic valve was normal in structure. Pulmonic valve regurgitation is not visualized. No evidence of pulmonic stenosis.  Aorta: The aortic root is normal in size and structure.  Venous: The inferior vena cava is normal in size  with greater than 50% respiratory variability, suggesting right atrial pressure of 3 mmHg.  IAS/Shunts: No atrial level shunt detected by color flow Doppler.   LEFT VENTRICLE PLAX 2D LVIDd:         5.10 cm LVIDs:         3.90 cm LV PW:         0.90 cm LV IVS:        0.90 cm LVOT diam:     2.00 cm LV SV:         46 LV SV Index:   26 LVOT Area:     3.14 cm   IVC IVC diam: 1.40 cm  LEFT ATRIUM             Index        RIGHT ATRIUM           Index LA diam:        4.30 cm 2.48 cm/m   RA Area:     13.00 cm LA  Vol [facility]):   57.9 ml 33.43 ml/m  RA Volume:   28.80 ml  16.63 ml/m LA Vol (A4C):   50.8 ml 29.33 ml/m LA Biplane Vol: 56.3 ml 32.51 ml/m AORTIC VALVE LVOT Vmax:   88.50 cm/s LVOT Vmean:  58.600 cm/s LVOT VTI:    0.146 m  AORTA Ao Root diam: 2.70 cm Ao Asc diam:  3.30 cm  TRICUSPID VALVE TR Peak grad:   29.6 mmHg TR Vmax:        272.00 cm/s  SHUNTS Systemic VTI:  0.15 m Systemic Diam: 2.00 cm  Mihai Croitoru MD Electronically signed by Thurmon Fair MD Signature Date/Time: 02/11/2021/4:06:41 PM    Final    MONITORS  LONG TERM MONITOR (3-14 DAYS) 10/17/2020  Narrative Patch Wear Time:  6 days and 18 hours (2022-09-30T14:32:36-0400 to 2022-10-07T09:05:28-0400)  Atrial Fibrillation occurred continuously (100% burden), ranging from 42-174 bpm (avg of 82 bpm). Isolated VEs were rare (<1.0%), and no VE Couplets or VE Triplets were present.  The cardiac rhythm throughout was atrial fibrillation The average heart rate 82 bpm Heart rate was in range 50 to 110 bpm 90% of the time day 10% of the time 110 to 151% of the time greater than 150 bpm. Heart rate was in range 5210 bpm 100% of the time at night There were no pauses of 3 seconds or greater and no sustained slow response to atrial fibrillation Ventricular ectopy was rare without couplets or triplets There was 1 triggered event controlled atrial fibrillation There was 1 diary event with rapid  ventricular rate under 112-157 bpm   Conclusion atrial fibrillation in general the heart rate is well controlled.           Risk Assessment/Calculations:    CHA2DS2-VASc Score = 4   This indicates a 4.8% annual risk of stroke. The patient's score is based upon: CHF History: 0 HTN History: 1 Diabetes History: 0 Stroke History: 0 Vascular Disease History: 0 Age Score: 2 Gender Score: 1    HYPERTENSION CONTROL Vitals:   07/03/22 0919 07/03/22 1020  BP: (!) 140/80 (!) 140/80    The patient's blood pressure is elevated above target today.  In order to address the patient's elevated BP: Blood pressure will be monitored at home to determine if medication changes need to be made.          Physical Exam:   VS:  BP (!) 140/80   Pulse 98   Ht 5\' 3"  (1.6 m)   Wt 150 lb (68 kg)   SpO2 96%   BMI 26.57 kg/m    Wt Readings from Last 3 Encounters:  07/03/22 150 lb (68 kg)  03/11/22 145 lb (65.8 kg)  09/26/21 149 lb 6.4 oz (67.8 kg)    GEN: Well nourished, well developed in no acute distress NECK: No JVD; No carotid bruits CARDIAC: Irregularly irregular, no murmurs, rubs, gallops RESPIRATORY:  Clear to auscultation without rales, wheezing or rhonchi  ABDOMEN: Soft, non-tender, non-distended EXTREMITIES:  No edema; No deformity; discoloration noted, +2 DP and PT bilaterally  ASSESSMENT AND French: .   Atrial fibrillation/hypercoagulable state-CHA2DS2-VASc score of 4, she is in atrial fibrillation today, rate is currently controlled.  Continue Eliquis 5 mg twice daily--no indication for dose reduction, recent hemoglobin and hematocrit without signs of anemia, will recheck BMET today for kidney function.  Continue diltiazem 30 mg 3 times daily.  She was previously unable to tolerate beta-blockers. Hypertension-blood pressure is elevated at 140/80 however she states she was  rushing to get here--she is not able to tolerate her blood pressure medicine or secondary to dizziness, continue  Lotensin 40 mg daily, continue Catapres 0.1 mg at bedtime. Hemosiderin -she is bothered by the appearance of them, there is no edema present, she has good pulses.  She is interesting in a topical cream and advised her to follow-up with a dermatologist to see if there is anything that might help lighten the discoloration.        Dispo: Keep follow-up appointment with Dr. Dulce Sellar in September, repeat BMET today.  Signed, Flossie Dibble, NP

## 2022-07-03 ENCOUNTER — Ambulatory Visit: Payer: Medicare Other | Attending: Cardiology | Admitting: Cardiology

## 2022-07-03 ENCOUNTER — Encounter: Payer: Self-pay | Admitting: Cardiology

## 2022-07-03 VITALS — BP 140/80 | HR 98 | Ht 63.0 in | Wt 150.0 lb

## 2022-07-03 DIAGNOSIS — D6869 Other thrombophilia: Secondary | ICD-10-CM

## 2022-07-03 DIAGNOSIS — E871 Hypo-osmolality and hyponatremia: Secondary | ICD-10-CM

## 2022-07-03 DIAGNOSIS — L818 Other specified disorders of pigmentation: Secondary | ICD-10-CM | POA: Diagnosis not present

## 2022-07-03 DIAGNOSIS — I4819 Other persistent atrial fibrillation: Secondary | ICD-10-CM

## 2022-07-03 DIAGNOSIS — I119 Hypertensive heart disease without heart failure: Secondary | ICD-10-CM

## 2022-07-03 DIAGNOSIS — F419 Anxiety disorder, unspecified: Secondary | ICD-10-CM

## 2022-07-03 DIAGNOSIS — I1A0 Resistant hypertension: Secondary | ICD-10-CM

## 2022-07-03 DIAGNOSIS — G8929 Other chronic pain: Secondary | ICD-10-CM

## 2022-07-03 DIAGNOSIS — R42 Dizziness and giddiness: Secondary | ICD-10-CM

## 2022-07-03 DIAGNOSIS — K259 Gastric ulcer, unspecified as acute or chronic, without hemorrhage or perforation: Secondary | ICD-10-CM | POA: Diagnosis not present

## 2022-07-03 DIAGNOSIS — I480 Paroxysmal atrial fibrillation: Secondary | ICD-10-CM

## 2022-07-03 DIAGNOSIS — E785 Hyperlipidemia, unspecified: Secondary | ICD-10-CM

## 2022-07-03 DIAGNOSIS — F0390 Unspecified dementia without behavioral disturbance: Secondary | ICD-10-CM

## 2022-07-03 DIAGNOSIS — M1990 Unspecified osteoarthritis, unspecified site: Secondary | ICD-10-CM

## 2022-07-03 DIAGNOSIS — N179 Acute kidney failure, unspecified: Secondary | ICD-10-CM

## 2022-07-03 NOTE — Patient Instructions (Signed)
Medication Instructions:  Your physician recommends that you continue on your current medications as directed. Please refer to the Current Medication list given to you today.  *If you need a refill on your cardiac medications before your next appointment, please call your pharmacy*   Lab Work: Your physician recommends that you return for lab work in:   Labs today: BMP  If you have labs (blood work) drawn today and your tests are completely normal, you will receive your results only by: MyChart Message (if you have MyChart) OR A paper copy in the mail If you have any lab test that is abnormal or we need to change your treatment, we will call you to review the results.   Testing/Procedures: None   Follow-Up: At [facility], you and your health needs are our priority.  As part of our continuing mission to provide you with exceptional heart care, we have created designated Provider Care Teams.  These Care Teams include your primary Cardiologist (physician) and Advanced Practice Providers (APPs -  Physician Assistants and Nurse Practitioners) who all work together to provide you with the care you need, when you need it.  We recommend signing up for the patient portal called "MyChart".  Sign up information is provided on this After Visit Summary.  MyChart is used to connect with patients for Virtual Visits (Telemedicine).  Patients are able to view lab/test results, encounter notes, upcoming appointments, etc.  Non-urgent messages can be sent to your provider as well.   To learn more about what you can do with MyChart, go to [email or web address].    Your next appointment:   Please keep future appointment with Dr. Dulce Sellar  Provider:   Norman Herrlich, MD    Other Instructions None

## 2022-07-04 LAB — BASIC METABOLIC PANEL WITH GFR
BUN/Creatinine Ratio: 18 (ref 12–28)
BUN: 16 mg/dL (ref 8–27)
CO2: 23 mmol/L (ref 20–29)
Calcium: 9.8 mg/dL (ref 8.7–10.3)
Chloride: 107 mmol/L — ABNORMAL HIGH (ref 96–106)
Creatinine, Ser: 0.88 mg/dL (ref 0.57–1.00)
Glucose: 101 mg/dL — ABNORMAL HIGH (ref 70–99)
Potassium: 3.9 mmol/L (ref 3.5–5.2)
Sodium: 144 mmol/L (ref 134–144)
eGFR: 63 mL/min/1.73

## 2022-07-10 DIAGNOSIS — I8310 Varicose veins of unspecified lower extremity with inflammation: Secondary | ICD-10-CM | POA: Diagnosis not present

## 2022-07-24 ENCOUNTER — Ambulatory Visit: Payer: Medicare Other | Admitting: Cardiology

## 2022-07-31 ENCOUNTER — Telehealth: Payer: Self-pay | Admitting: Cardiology

## 2022-07-31 NOTE — Telephone Encounter (Signed)
I cannot speak to the safety or if it works because these supplements are not all that well studied. There is a theoretical increased risk of bleeding with this medication (tumeric) since she is on Eliquis.

## 2022-07-31 NOTE — Telephone Encounter (Signed)
Recommendation reviewed with pt as per Dr. Hulen Shouts note.  Pt verbalized understanding and had no additional questions.

## 2022-07-31 NOTE — Telephone Encounter (Signed)
Pt c/o medication issue:  1. Name of Medication: Rescue Flex Support Joint Health   2. How are you currently taking this medication (dosage and times per day)? Not yes started   3. Are you having a reaction (difficulty breathing--STAT)?   4. What is your medication issue? Patient is requesting call back to discuss this medication to make sure she is okay to take this. Please advise.

## 2022-08-21 ENCOUNTER — Encounter (HOSPITAL_COMMUNITY): Payer: Self-pay

## 2022-08-21 ENCOUNTER — Telehealth: Payer: Self-pay | Admitting: Cardiology

## 2022-08-21 ENCOUNTER — Emergency Department (HOSPITAL_COMMUNITY): Payer: Medicare Other

## 2022-08-21 ENCOUNTER — Other Ambulatory Visit: Payer: Self-pay

## 2022-08-21 ENCOUNTER — Emergency Department (HOSPITAL_COMMUNITY)
Admission: EM | Admit: 2022-08-21 | Discharge: 2022-08-22 | Disposition: A | Discharge status: Home / Self Care | Payer: Medicare Other | Attending: Emergency Medicine | Admitting: Emergency Medicine

## 2022-08-21 DIAGNOSIS — Z79899 Other long term (current) drug therapy: Secondary | ICD-10-CM | POA: Diagnosis not present

## 2022-08-21 DIAGNOSIS — M7989 Other specified soft tissue disorders: Secondary | ICD-10-CM | POA: Diagnosis not present

## 2022-08-21 DIAGNOSIS — Z7901 Long term (current) use of anticoagulants: Secondary | ICD-10-CM | POA: Diagnosis not present

## 2022-08-21 DIAGNOSIS — I10 Essential (primary) hypertension: Secondary | ICD-10-CM | POA: Diagnosis not present

## 2022-08-21 DIAGNOSIS — R600 Localized edema: Secondary | ICD-10-CM | POA: Diagnosis not present

## 2022-08-21 DIAGNOSIS — I4891 Unspecified atrial fibrillation: Secondary | ICD-10-CM | POA: Insufficient documentation

## 2022-08-21 DIAGNOSIS — M25472 Effusion, left ankle: Secondary | ICD-10-CM | POA: Insufficient documentation

## 2022-08-21 DIAGNOSIS — R2240 Localized swelling, mass and lump, unspecified lower limb: Secondary | ICD-10-CM | POA: Diagnosis not present

## 2022-08-21 DIAGNOSIS — E876 Hypokalemia: Secondary | ICD-10-CM | POA: Insufficient documentation

## 2022-08-21 LAB — CBC
HCT: 41 % (ref 36.0–46.0)
Hemoglobin: 13 g/dL (ref 12.0–15.0)
MCH: 29.2 pg (ref 26.0–34.0)
MCHC: 31.7 g/dL (ref 30.0–36.0)
MCV: 92.1 fL (ref 80.0–100.0)
Platelets: 218 10*3/uL (ref 150–400)
RBC: 4.45 MIL/uL (ref 3.87–5.11)
RDW: 13.7 % (ref 11.5–15.5)
WBC: 6.6 10*3/uL (ref 4.0–10.5)
nRBC: 0 % (ref 0.0–0.2)

## 2022-08-21 LAB — BASIC METABOLIC PANEL
Anion gap: 13 (ref 5–15)
BUN: 9 mg/dL (ref 8–23)
CO2: 24 mmol/L (ref 22–32)
Calcium: 9.6 mg/dL (ref 8.9–10.3)
Chloride: 104 mmol/L (ref 98–111)
Creatinine, Ser: 0.85 mg/dL (ref 0.44–1.00)
GFR, Estimated: >60 mL/min (ref >60)
Glucose, Bld: 106 mg/dL — ABNORMAL HIGH (ref 70–99)
Potassium: 3.3 mmol/L — ABNORMAL LOW (ref 3.5–5.1)
Sodium: 141 mmol/L (ref 135–145)

## 2022-08-21 NOTE — Telephone Encounter (Signed)
  Pt c/o swelling/edema: STAT if pt has developed SOB within 24 hours  If swelling, where is the swelling located? Swelling in her foot and left leg  How much weight have you gained and in what time span? No weight gain   Have you gained 2 pounds in a day or 5 pounds in a week?   Do you have a log of your daily weights (if so, list)?   Are you currently taking a fluid pill? No   Are you currently SOB? No   Have you traveled recently in a car or plane for an extended period of time? No    Pt said, her feet and left leg is swelling, she is concern because she have varicose veins behind her legs. She denied and weight gain and SOB

## 2022-08-21 NOTE — ED Triage Notes (Signed)
Pt c/o left ankle swellingx3d. Pt has 2+ swelling of left ankle. PT denies injury. Pt has 1+ left pedal pulse, cap refill less than 3 sec.

## 2022-08-21 NOTE — Telephone Encounter (Signed)
Patient states she is having swelling in her left foot and leg x 3 days.  No weight gain, cp, sob, or dizziness.  Does mention that she has some varicose veins in that same leg and has been wearing her compression stockings (above the knee) and does not seem to be helping.  Leg / foot also does not improve with rest.

## 2022-08-21 NOTE — Telephone Encounter (Signed)
Patient notified and verbalized understanding. 

## 2022-08-21 NOTE — ED Provider Triage Note (Signed)
Emergency Medicine Provider Triage Evaluation Note  Monica French , a 89 y.o. female  was evaluated in triage.  Pt complains of left ankle swelling for the past 3 days.  Patient denies any injuries to the area.  No pain in the area.  She denies any periods of immobilization, recent surgeries, recent long plane or car rides, any hormonal therapies.  Denies any changes in her medications.  No shortness of breath or chest pain.  Review of Systems  Positive: As above Negative: As above  Physical Exam  BP (!) 188/88   Pulse 79   Temp 98.1 F (36.7 C)   Resp 18   Ht 5\' 3"  (1.6 m)   Wt 68 kg   SpO2 98%   BMI 26.56 kg/m  Gen:   Awake, no distress   Resp:  Normal effort  MSK:   Moves extremities without difficulty  Other:  1+ edema of the left ankle that extends into the mid calf.  No tenderness to palpation of the left calf 1+ dorsalis pedis pulse  Medical Decision Making  Medically screening exam initiated at 7:25 PM.  Appropriate orders placed.  ZAYDIE IRONS was informed that the remainder of the evaluation will be completed by another provider, this initial triage assessment does not replace that evaluation, and the importance of remaining in the ED until their evaluation is complete.     Arabella Merles, PA-C 08/21/22 1927

## 2022-08-22 ENCOUNTER — Ambulatory Visit (HOSPITAL_BASED_OUTPATIENT_CLINIC_OR_DEPARTMENT_OTHER)
Admission: RE | Admit: 2022-08-22 | Discharge: 2022-08-22 | Disposition: A | Discharge status: Home / Self Care | Payer: Medicare Other | Source: Ambulatory Visit | Attending: Emergency Medicine | Admitting: Emergency Medicine

## 2022-08-22 DIAGNOSIS — M7989 Other specified soft tissue disorders: Secondary | ICD-10-CM | POA: Diagnosis not present

## 2022-08-22 DIAGNOSIS — M25472 Effusion, left ankle: Secondary | ICD-10-CM | POA: Insufficient documentation

## 2022-08-22 LAB — BRAIN NATRIURETIC PEPTIDE: B Natriuretic Peptide: 567.4 pg/mL — ABNORMAL HIGH (ref 0.0–100.0)

## 2022-08-22 MED ORDER — FUROSEMIDE 20 MG PO TABS
20.0000 mg | ORAL_TABLET | Freq: Once | ORAL | Status: AC
  Administered 2022-08-22: 20 mg via ORAL
  Filled 2022-08-22: qty 1

## 2022-08-22 MED ORDER — POTASSIUM CHLORIDE CRYS ER 20 MEQ PO TBCR
40.0000 meq | EXTENDED_RELEASE_TABLET | Freq: Once | ORAL | Status: AC
  Administered 2022-08-22: 40 meq via ORAL
  Filled 2022-08-22: qty 2

## 2022-08-22 NOTE — Discharge Instructions (Signed)
X-rays negative for fracture.  Continue your blood thinner Eliquis.  Follow-up tomorrow for an ultrasound of the leg to rule out a blood clot.  Follow-up with your doctor for recheck this week.  You may require additional doses of Lasix if the swelling does not improve.  Return to the ED with chest pain, shortness of breath, other concerns.

## 2022-08-22 NOTE — ED Provider Notes (Signed)
[facility] EMERGENCY DEPARTMENT AT [facility] Provider Note   CSN: [id number] Arrival date & time: 08/21/22  1725     History  Chief Complaint  Patient presents with   Joint Swelling    Monica French is a 89 y.o. female.  Patient with a history of hypertension, atrial fibrillation on Eliquis presenting with left ankle and leg swelling for the past 2 to 3 days.  No trauma.  Denies pain.  Is just noted swelling to her left leg despite try to keep it elevated at home.  No history of DVT or PE.  No chest pain or shortness of breath.  No nausea, vomiting, cough or fever.  No focal weakness, numbness or tingling.  No pain or swelling past her mid lower leg  The history is provided by the patient and a relative.       Home Medications Prior to Admission medications   Medication Sig Start Date End Date Taking? Authorizing Provider  ALPRAZolam Prudy Feeler) 0.25 MG tablet Take 0.25 mg by mouth daily as needed for anxiety. 06/11/20   [provider]  apixaban (ELIQUIS) 5 MG TABS tablet Take 1 tablet (5 mg total) by mouth 2 (two) times daily. 05/10/22   Baldo Daub, MD  Ascorbic Acid (VITAMIN C) 1000 MG tablet Take 1,000 mg by mouth daily.    [provider]  Baclofen 5 MG TABS Take 1 tablet by mouth 3 (three) times daily. 02/07/22   [provider]  benazepril (LOTENSIN) 40 MG tablet Take 40 mg by mouth every morning. 06/11/20   [provider]  cholecalciferol (VITAMIN D3) 25 MCG (1000 UNIT) tablet Take 1,000 Units by mouth daily.    [provider]  cloNIDine (CATAPRES) 0.1 MG tablet Take 0.1 mg by mouth at bedtime. 02/08/21   [provider]  diltiazem (CARDIZEM) 30 MG tablet TAKE 1 TABLET(30 MG) BY MOUTH THREE TIMES DAILY 09/26/21   Baldo Daub, MD  Ergocalciferol 10 MCG (400 UNIT) TABS Take 10 mcg by mouth daily. 07/13/20   [provider]  LYCOPENE PO Take 1 tablet by mouth daily. 07/13/20   [provider]   meclizine (ANTIVERT) 12.5 MG tablet Take 1 tablet (12.5 mg total) by mouth 3 (three) times daily as needed for dizziness. 10/13/21   Gilda Crease, MD  Multiple Vitamins-Minerals (CENTRUM SILVER 50+WOMEN) TABS Take 1 tablet by mouth daily.    [provider]  omeprazole (PRILOSEC) 20 MG capsule Take 1 capsule (20 mg total) by mouth daily. 03/22/22   Baldo Daub, MD  ondansetron (ZOFRAN-ODT) 4 MG disintegrating tablet 4mg  ODT q4 hours prn nausea/vomit 10/13/21   Pollina, Canary Brim, MD      Allergies    Codeine and Triamterene    Review of Systems   Review of Systems  Constitutional:  Negative for activity change and appetite change.  HENT:  Negative for congestion and rhinorrhea.   Respiratory:  Negative for cough, chest tightness and shortness of breath.   Cardiovascular:  Positive for leg swelling.  Gastrointestinal:  Negative for abdominal pain, nausea and vomiting.  Musculoskeletal:  Positive for arthralgias and myalgias.  Skin:  Negative for rash.  Neurological:  Negative for dizziness, weakness and headaches.   all other systems are negative except as noted in the HPI and PMH.    Physical Exam Updated Vital Signs BP (!) 167/88 (BP Location: Left Arm)   Pulse 95   Temp 98.5 F (36.9 C)  Resp 16   Ht 5\' 3"  (1.6 m)   Wt 68 kg   SpO2 100%   BMI 26.56 kg/m  Physical Exam Vitals and nursing note reviewed.  Constitutional:      General: She is not in acute distress.    Appearance: She is well-developed.  HENT:     Head: Normocephalic and atraumatic.     Mouth/Throat:     Pharynx: No oropharyngeal exudate.  Eyes:     Conjunctiva/sclera: Conjunctivae normal.     Pupils: Pupils are equal, round, and reactive to light.  Neck:     Comments: No meningismus. Cardiovascular:     Rate and Rhythm: Normal rate. Rhythm irregular.     Heart sounds: Normal heart sounds. No murmur heard. Pulmonary:     Effort: Pulmonary effort is normal. No respiratory  distress.     Breath sounds: Normal breath sounds.  Abdominal:     Palpations: Abdomen is soft.     Tenderness: There is no abdominal tenderness. There is no guarding or rebound.  Musculoskeletal:        General: No tenderness. Normal range of motion.     Cervical back: Normal range of motion and neck supple.     Left lower leg: Edema present.     Comments: Asymmetric edema involving left leg and ankle.  Intact DP and PT pulse.  No warmth or erythema.  No calf tenderness  Skin:    General: Skin is warm.  Neurological:     Mental Status: She is alert and oriented to person, place, and time.     Cranial Nerves: No cranial nerve deficit.     Motor: No abnormal muscle tone.     Coordination: Coordination normal.     Comments:  5/5 strength throughout. CN 2-12 intact.Equal grip strength.   Psychiatric:        Behavior: Behavior normal.     ED Results / Procedures / Treatments   Labs (all labs ordered are listed, but only abnormal results are displayed) Labs Reviewed  BASIC METABOLIC PANEL - Abnormal; Notable for the following components:      Result Value   Potassium 3.3 (*)    Glucose, Bld 106 (*)    All other components within normal limits  BRAIN NATRIURETIC PEPTIDE - Abnormal; Notable for the following components:   B Natriuretic Peptide 567.4 (*)    All other components within normal limits  CBC    EKG None  Radiology DG Ankle Complete Left  Result Date: 08/21/2022 CLINICAL DATA:  Swelling EXAM: LEFT ANKLE COMPLETE - 3+ VIEW COMPARISON:  None Available. FINDINGS: There is no evidence of fracture, dislocation, or joint effusion. There is no evidence of arthropathy or other focal bone abnormality. There is soft tissue swelling of the lower extremity and ankle. IMPRESSION: 1. No acute fracture or dislocation. 2. Soft tissue swelling of the lower extremity and ankle. Electronically Signed   By: Darliss Cheney M.D.   On: 08/21/2022 20:16    Procedures Procedures     Medications Ordered in ED Medications  furosemide (LASIX) tablet 20 mg (has no administration in time range)  potassium chloride SA (KLOR-CON M) CR tablet 40 mEq (has no administration in time range)    ED Course/ Medical Decision Making/ A&P                                 Medical Decision Making Amount and/or  Complexity of Data Reviewed Labs: ordered. Decision-making details documented in ED Course. Radiology: ordered and independent interpretation performed. Decision-making details documented in ED Course. ECG/medicine tests: ordered and independent interpretation performed. Decision-making details documented in ED Course.  Risk Prescription drug management.   Left leg pain and swelling.  No trauma.  Neurovascularly intact.  No chest pain or shortness of breath.  No cellulitis.  X-ray obtained in triage is negative for fracture.  Results reviewed interpreted by me.  Screening labs show mild hypokalemia which is replaced.  Considered DVT.  Patient however is on Eliquis with no missed doses.  Ultrasound not available at this time of night.  Will arrange for tomorrow.  Patient given dose of p.o. Lasix as well as potassium  Patient able to ambulate.  Has no pain.  Will arrange for outpatient Doppler ultrasound tomorrow.  Dose of potassium and Lasix given.  Denies chest pain or shortness of breath.  Low suspicion for CHF.  Follow-up with PCP.  Return to the ED sooner with difficulty breathing, chest pain, worsening swelling, fevers, cough, or any other concerns.        Final Clinical Impression(s) / ED Diagnoses Final diagnoses:  Leg swelling    Rx / DC Orders ED Discharge Orders     None         Obadiah Dennard, Jeannett Senior, MD 08/22/22 [phone]

## 2022-08-31 DIAGNOSIS — I83209 Varicose veins of unspecified lower extremity with both ulcer of unspecified site and inflammation: Secondary | ICD-10-CM | POA: Diagnosis not present

## 2022-08-31 DIAGNOSIS — L97909 Non-pressure chronic ulcer of unspecified part of unspecified lower leg with unspecified severity: Secondary | ICD-10-CM | POA: Diagnosis not present

## 2022-08-31 DIAGNOSIS — I872 Venous insufficiency (chronic) (peripheral): Secondary | ICD-10-CM | POA: Diagnosis not present

## 2022-09-13 NOTE — Progress Notes (Signed)
[facility] Office Note:    Date:  09/18/2022   ID:  Monica French, DOB 05/29/1931, MRN [id number]  PCP:  Street, Stephanie Coup, MD  Cardiologist:  Norman Herrlich, MD    Referring MD: [address], Stephanie Coup, *    ASSESSMENT:    1. Permanent atrial fibrillation (HCC)   2. Chronic anticoagulation   3. Hypertensive heart disease without heart failure   4. Secondary hypercoagulable state (HCC)   5. Hyponatremia   6. Chronic gastric ulcer with hemorrhage    PLAN:    In order of problems listed above:  Stable rate controlled atrial fibrillation continue her calcium channel blocker I suspect is the mechanism of her intermittent edema but previously intolerant to beta-blocker with CNS side effects as well as her anticoagulant and her PPI with previous ulcer GI bleed Hypertension well-controlled with her calcium channel blocker and clonidine I am concerned with her taking diuretic daily will recheck her renal function serum sodium and a magnesium with hypokalemia take potassium daily   Next appointment: 6 months   Medication Adjustments/Labs and Tests Ordered: Current medicines are reviewed at length with the patient today.  Concerns regarding medicines are outlined above.  Orders Placed This Encounter  Procedures   EKG 12-Lead   No orders of the defined types were placed in this encounter.    History of Present Illness:    Monica French is a 89 y.o. female with a hx of longstanding persistent chronic atrial fibrillation with chronic anticoagulation hypertensive heart disease without heart failure and hyponatremia last seen 09/24/2021.  She had recent evaluation for lower extremity swelling duplex showed no evidence of DVT.  Recent labs shows a serum sodium 141 potassium 4.3 creatinine 0.85 GFR 60 cc/min and BNP modestly elevated 567 08/21/2022.  Compliance with diet, lifestyle and medications: Yes  She had been taking her diuretic every day previously had hyponatremia  have asked her not to take more than every other day recheck labs today including BMP and magnesium with previous hypokalemia She has noticed lower extremity edema she takes a calcium channel blocker she has none today He is having severe right knee pain and is considering total knee arthroplasty She is not having shortness of breath chest pain or syncope She tolerates her anticoagulant without bleeding Past Medical History:  Diagnosis Date   Anxiety    Arthritis    Gastric ulcer    Hyperlipidemia    Hypertension     Current Medications: Current Meds  Medication Sig   ALPRAZolam (XANAX) 0.25 MG tablet Take 0.25 mg by mouth daily as needed for anxiety.   apixaban (ELIQUIS) 5 MG TABS tablet Take 1 tablet (5 mg total) by mouth 2 (two) times daily.   Ascorbic Acid (VITAMIN C) 1000 MG tablet Take 1,000 mg by mouth daily.   Baclofen 5 MG TABS Take 1 tablet by mouth 3 (three) times daily.   benazepril (LOTENSIN) 40 MG tablet Take 40 mg by mouth every morning.   cholecalciferol (VITAMIN D3) 25 MCG (1000 UNIT) tablet Take 1,000 Units by mouth daily.   cloNIDine (CATAPRES) 0.1 MG tablet Take 0.1 mg by mouth at bedtime.   diltiazem (CARDIZEM) 30 MG tablet TAKE 1 TABLET(30 MG) BY MOUTH THREE TIMES DAILY   Ergocalciferol 10 MCG (400 UNIT) TABS Take 10 mcg by mouth daily.   furosemide (LASIX) 20 MG tablet Take 10-20 mg by mouth daily as needed.   LYCOPENE PO Take 1 tablet by mouth daily.   meclizine (  ANTIVERT) 12.5 MG tablet Take 1 tablet (12.5 mg total) by mouth 3 (three) times daily as needed for dizziness.   Multiple Vitamins-Minerals (CENTRUM SILVER 50+WOMEN) TABS Take 1 tablet by mouth daily.   omeprazole (PRILOSEC) 20 MG capsule Take 1 capsule (20 mg total) by mouth daily.   ondansetron (ZOFRAN-ODT) 4 MG disintegrating tablet 4mg  ODT q4 hours prn nausea/vomit   potassium chloride SA (KLOR-CON M) 20 MEQ tablet Take 20 mEq by mouth daily.      EKGs/Labs/Other Studies Reviewed:    The  following studies were reviewed today:  EKG Interpretation Date/Time:  Monday September 18 2022 10:31:58 EDT Ventricular Rate:  101 PR Interval:    QRS Duration:  90 QT Interval:  350 QTC Calculation: 453 R Axis:   70  Text Interpretation: Atrial fibrillation with rapid ventricular response When compared with ECG of 12-Oct-2021 22:12, rate is now controlled T wave inversion now evident in Lateral leads Confirmed by Norman Herrlich ([id number]) on 09/18/2022 10:36:50 AM   Recent Labs: 10/12/2021: ALT 12; Magnesium 1.9 08/21/2022: B Natriuretic Peptide 567.4; BUN 9; Creatinine, Ser 0.85; Hemoglobin 13.0; Platelets 218; Potassium 3.3; Sodium 141    Physical Exam:    VS:  BP (!) 142/70 (BP Location: Left Arm, Patient Position: Sitting, Cuff Size: Normal)   Pulse (!) 101   Ht 5\' 3"  (1.6 m)   Wt 148 lb (67.1 kg)   SpO2 97%   BMI 26.22 kg/m     Wt Readings from Last 3 Encounters:  09/18/22 148 lb (67.1 kg)  08/21/22 149 lb 14.6 oz (68 kg)  07/03/22 150 lb (68 kg)     GEN: She appears stronger well nourished, well developed in no acute distress HEENT: Normal NECK: No JVD; No carotid bruits LYMPHATICS: No lymphadenopathy CARDIAC: Irregular rate and rhythm variable first heart sound no murmurs, rubs, gallops RESPIRATORY:  Clear to auscultation without rales, wheezing or rhonchi  ABDOMEN: Soft, non-tender, non-distended MUSCULOSKELETAL:  No edema; No deformity  SKIN: Warm and dry NEUROLOGIC:  Alert and oriented x 3 PSYCHIATRIC:  Normal affect    Signed, Norman Herrlich, MD  09/18/2022 10:47 AM    [facility]

## 2022-09-18 ENCOUNTER — Ambulatory Visit: Payer: Medicare Other | Attending: Cardiology | Admitting: Cardiology

## 2022-09-18 ENCOUNTER — Encounter: Payer: Self-pay | Admitting: Cardiology

## 2022-09-18 ENCOUNTER — Other Ambulatory Visit: Payer: Self-pay | Admitting: Cardiology

## 2022-09-18 VITALS — BP 142/70 | HR 101 | Ht 63.0 in | Wt 148.0 lb

## 2022-09-18 DIAGNOSIS — Z7901 Long term (current) use of anticoagulants: Secondary | ICD-10-CM

## 2022-09-18 DIAGNOSIS — I119 Hypertensive heart disease without heart failure: Secondary | ICD-10-CM

## 2022-09-18 DIAGNOSIS — K254 Chronic or unspecified gastric ulcer with hemorrhage: Secondary | ICD-10-CM

## 2022-09-18 DIAGNOSIS — I4821 Permanent atrial fibrillation: Secondary | ICD-10-CM

## 2022-09-18 DIAGNOSIS — D6869 Other thrombophilia: Secondary | ICD-10-CM | POA: Diagnosis not present

## 2022-09-18 DIAGNOSIS — E871 Hypo-osmolality and hyponatremia: Secondary | ICD-10-CM | POA: Diagnosis not present

## 2022-09-18 DIAGNOSIS — Z791 Long term (current) use of non-steroidal anti-inflammatories (NSAID): Secondary | ICD-10-CM | POA: Diagnosis not present

## 2022-09-18 DIAGNOSIS — E781 Pure hyperglyceridemia: Secondary | ICD-10-CM | POA: Diagnosis not present

## 2022-09-18 MED ORDER — FUROSEMIDE 20 MG PO TABS: ORAL_TABLET | ORAL | 3 refills | Status: AC

## 2022-09-18 NOTE — Patient Instructions (Signed)
Medication Instructions:  Your physician has recommended you make the following change in your medication:   START: Furosemide 20 mg every other day as needed   *If you need a refill on your cardiac medications before your next appointment, please call your pharmacy*   Lab Work: Your physician recommends that you return for lab work in:   Labs today: BMP, Magnesium  If you have labs (blood work) drawn today and your tests are completely normal, you will receive your results only by: MyChart Message (if you have MyChart) OR A paper copy in the mail If you have any lab test that is abnormal or we need to change your treatment, we will call you to review the results.   Testing/Procedures: None   Follow-Up: At [facility], you and your health needs are our priority.  As part of our continuing mission to provide you with exceptional heart care, we have created designated Provider Care Teams.  These Care Teams include your primary Cardiologist (physician) and Advanced Practice Providers (APPs -  Physician Assistants and Nurse Practitioners) who all work together to provide you with the care you need, when you need it.  We recommend signing up for the patient portal called "MyChart".  Sign up information is provided on this After Visit Summary.  MyChart is used to connect with patients for Virtual Visits (Telemedicine).  Patients are able to view lab/test results, encounter notes, upcoming appointments, etc.  Non-urgent messages can be sent to your provider as well.   To learn more about what you can do with MyChart, go to [email or web address].    Your next appointment:   6 month(s)  Provider:   Norman Herrlich, MD    Other Instructions None

## 2022-09-19 LAB — BASIC METABOLIC PANEL
BUN/Creatinine Ratio: 25 (ref 12–28)
BUN: 25 mg/dL (ref 8–27)
CO2: 23 mmol/L (ref 20–29)
Calcium: 9.9 mg/dL (ref 8.7–10.3)
Chloride: 104 mmol/L (ref 96–106)
Creatinine, Ser: 0.99 mg/dL (ref 0.57–1.00)
Glucose: 89 mg/dL (ref 70–99)
Potassium: 4.8 mmol/L (ref 3.5–5.2)
Sodium: 143 mmol/L (ref 134–144)
eGFR: 55 mL/min/{1.73_m2} — ABNORMAL LOW (ref >59)

## 2022-09-19 LAB — MAGNESIUM: Magnesium: 2.1 mg/dL (ref 1.6–2.3)

## 2022-09-23 DIAGNOSIS — R059 Cough, unspecified: Secondary | ICD-10-CM | POA: Diagnosis not present

## 2022-09-23 DIAGNOSIS — R0981 Nasal congestion: Secondary | ICD-10-CM | POA: Diagnosis not present

## 2022-09-27 ENCOUNTER — Other Ambulatory Visit: Payer: Self-pay | Admitting: Cardiology

## 2022-10-03 DIAGNOSIS — H25813 Combined forms of age-related cataract, bilateral: Secondary | ICD-10-CM | POA: Diagnosis not present

## 2022-10-31 ENCOUNTER — Telehealth: Payer: Self-pay | Admitting: Cardiology

## 2022-10-31 ENCOUNTER — Telehealth: Payer: Self-pay

## 2022-10-31 DIAGNOSIS — M25461 Effusion, right knee: Secondary | ICD-10-CM | POA: Diagnosis not present

## 2022-10-31 DIAGNOSIS — E876 Hypokalemia: Secondary | ICD-10-CM

## 2022-10-31 DIAGNOSIS — G8929 Other chronic pain: Secondary | ICD-10-CM | POA: Diagnosis not present

## 2022-10-31 DIAGNOSIS — M1711 Unilateral primary osteoarthritis, right knee: Secondary | ICD-10-CM | POA: Diagnosis not present

## 2022-10-31 DIAGNOSIS — M25561 Pain in right knee: Secondary | ICD-10-CM | POA: Diagnosis not present

## 2022-10-31 NOTE — Telephone Encounter (Signed)
   Pre-operative Risk Assessment    Patient Name: Monica French  DOB: 05/03/1931 MRN: [id number]  DATE OF LAST VISIT: 09/18/22 DR. MUNLEY DATE OF NEXT VISIT: NONE    Request for Surgical Clearance    Procedure:   TOTAL KNEE ARTHROPLASTY  Date of Surgery:  Clearance TBD                                 Surgeon:  DR. Raymon Mutton Surgeon's Group or Practice Name:  [facility] Phone number:  [phone] Fax number:  [phone]   Type of Clearance Requested:   - Medical  - Pharmacy:  Hold Apixaban (Eliquis)     Type of Anesthesia:  Spinal   Additional requests/questions:    Signed, Giavanna Kang Tannar Broker   10/31/2022, 2:40 PM

## 2022-10-31 NOTE — Telephone Encounter (Signed)
Callback team please contact patient and arrange for BMET to be completed prior to final clearance being granted per Dr. Dulce Sellar.  Thanks

## 2022-10-31 NOTE — Telephone Encounter (Signed)
Pharmacy please advise on holding Eliquis prior to total knee arthroplasty scheduled for TBD. Thank you.

## 2022-10-31 NOTE — Telephone Encounter (Signed)
I s/w the pt and she is agreeable to go by the [facility] office tomorrow afternoon to get BMET per Dr. Dulce Sellar. I will place the lab order.

## 2022-10-31 NOTE — Addendum Note (Signed)
Addended by: Tarri Fuller on: 10/31/2022 05:56 PM   Modules accepted: Orders

## 2022-10-31 NOTE — Telephone Encounter (Signed)
Clearance request has been forwarded to pre op APP.

## 2022-10-31 NOTE — Telephone Encounter (Signed)
Pts son dropped off pre op clearance form for [facility]. Please fill out and and fax to [phone] and [phone].

## 2022-10-31 NOTE — Telephone Encounter (Signed)
Good Afternoon Dr. Dulce Sellar  We have received a surgical clearance request for Ms. Kovarik who is scheduled to undergo total knee arthroplasty. They were seen recently in clinic on 09/18/2022.  She has a PMH of permanent AF, HTN, HFpEF. Can you please comment on surgical clearance.  Please forward you guidance and recommendations to P CV DIV PREOP,  Thanks, Robin Searing, NP

## 2022-10-31 NOTE — Telephone Encounter (Signed)
Patient with diagnosis of afib on Eliquis for anticoagulation.    Procedure: TKA Date of procedure: TBD  CHA2DS2-VASc Score = 4  This indicates a 4.8% annual risk of stroke. The patient's score is based upon: CHF History: 0 HTN History: 1 Diabetes History: 0 Stroke History: 0 Vascular Disease History: 0 Age Score: 2 Gender Score: 1   CrCl 41mL/min Platelet count 218K  Per office protocol, patient can hold Eliquis for 3 days prior to procedure.    **This guidance is not considered finalized until pre-operative APP has relayed final recommendations.**

## 2022-11-01 ENCOUNTER — Other Ambulatory Visit: Payer: Self-pay | Admitting: Cardiology

## 2022-11-01 DIAGNOSIS — E876 Hypokalemia: Secondary | ICD-10-CM | POA: Diagnosis not present

## 2022-11-02 LAB — BASIC METABOLIC PANEL
BUN/Creatinine Ratio: 14 (ref 12–28)
BUN: 12 mg/dL (ref 8–27)
CO2: 24 mmol/L (ref 20–29)
Calcium: 10 mg/dL (ref 8.7–10.3)
Chloride: 105 mmol/L (ref 96–106)
Creatinine, Ser: 0.83 mg/dL (ref 0.57–1.00)
Glucose: 90 mg/dL (ref 70–99)
Potassium: 4.8 mmol/L (ref 3.5–5.2)
Sodium: 143 mmol/L (ref 134–144)
eGFR: 67 mL/min/{1.73_m2} (ref >59)

## 2022-11-07 ENCOUNTER — Other Ambulatory Visit: Payer: Self-pay | Admitting: Cardiology

## 2022-11-07 ENCOUNTER — Telehealth: Payer: Self-pay | Admitting: Cardiology

## 2022-11-07 DIAGNOSIS — I482 Chronic atrial fibrillation, unspecified: Secondary | ICD-10-CM | POA: Diagnosis not present

## 2022-11-07 DIAGNOSIS — M1991 Primary osteoarthritis, unspecified site: Secondary | ICD-10-CM | POA: Diagnosis not present

## 2022-11-07 DIAGNOSIS — I672 Cerebral atherosclerosis: Secondary | ICD-10-CM | POA: Diagnosis not present

## 2022-11-07 DIAGNOSIS — M1711 Unilateral primary osteoarthritis, right knee: Secondary | ICD-10-CM | POA: Diagnosis not present

## 2022-11-07 DIAGNOSIS — D6869 Other thrombophilia: Secondary | ICD-10-CM | POA: Diagnosis not present

## 2022-11-07 DIAGNOSIS — M353 Polymyalgia rheumatica: Secondary | ICD-10-CM | POA: Diagnosis not present

## 2022-11-07 DIAGNOSIS — I10 Essential (primary) hypertension: Secondary | ICD-10-CM | POA: Diagnosis not present

## 2022-11-07 DIAGNOSIS — E785 Hyperlipidemia, unspecified: Secondary | ICD-10-CM | POA: Diagnosis not present

## 2022-11-07 DIAGNOSIS — I480 Paroxysmal atrial fibrillation: Secondary | ICD-10-CM

## 2022-11-07 DIAGNOSIS — Z01818 Encounter for other preprocedural examination: Secondary | ICD-10-CM | POA: Diagnosis not present

## 2022-11-07 DIAGNOSIS — I700 Atherosclerosis of aorta: Secondary | ICD-10-CM | POA: Diagnosis not present

## 2022-11-07 DIAGNOSIS — G720 Drug-induced myopathy: Secondary | ICD-10-CM | POA: Diagnosis not present

## 2022-11-07 MED ORDER — APIXABAN 5 MG PO TABS: 5.0000 mg | ORAL_TABLET | Freq: Two times a day (BID) | ORAL | 1 refills | Status: DC

## 2022-11-07 NOTE — Telephone Encounter (Signed)
Prescription refill request for Eliquis received. Indication: AF Last office visit: 09/18/22  Caryl Pina MD Scr: 0.83 on 11/01/22  Epic Age: 89 Weight: 67.1kg  Based on above findings Eliquis 5mg  twice daily is the appropriate dose.  Refill approved.

## 2022-11-07 NOTE — Telephone Encounter (Signed)
*  STAT* If patient is at the pharmacy, call can be transferred to refill team.   1. Which medications need to be refilled? (please list name of each medication and dose if known)   ELIQUIS 5 MG TABS tablet   2. Would you like to learn more about the convenience, safety, & potential cost savings by using the [facility]?   3. Are you open to using the [facility] (Type [facility]. ).  4. Which pharmacy/location (including street and city if local pharmacy) is medication to be sent to?  [facility] [phone] - [place], [place] - [address]   5. Do they need a 30 day or 90 day supply?   90 day  Patient stated she is completely out of this medication.

## 2022-11-20 ENCOUNTER — Telehealth: Payer: Self-pay | Admitting: Cardiology

## 2022-11-20 NOTE — Telephone Encounter (Signed)
See phone note from 10/31/22, pt states that Dr. Tobin Chad office is waiting on Dr. Hulen Shouts clearance to schedule patients procedure. Please advise.

## 2022-11-21 NOTE — Telephone Encounter (Signed)
Will forward to pre op APP to review if the pt has been cleared.

## 2022-11-21 NOTE — Telephone Encounter (Signed)
   Patient Name: Monica French  DOB: 02-26-1931 MRN: [id number]  Primary Cardiologist: None  Chart reviewed as part of pre-operative protocol coverage. Given past medical history and time since last visit, based on ACC/AHA guidelines, ROSARIO WADLEY is at acceptable risk for the planned procedure without further cardiovascular testing.   The patient was advised that if she develops new symptoms prior to surgery to contact our office to arrange for a follow-up visit, and she verbalized understanding. Per office protocol, patient can hold Eliquis for 3 days prior to procedure.    I will route this recommendation to the requesting party via Epic fax function and remove from pre-op pool.  Please call with questions.  Napoleon Form, Leodis Rains, NP 11/21/2022, 2:14 PM

## 2022-12-19 NOTE — Progress Notes (Signed)
Sent message, via epic in basket, requesting orders in epic from surgeon.  

## 2022-12-22 DIAGNOSIS — M25561 Pain in right knee: Secondary | ICD-10-CM | POA: Diagnosis not present

## 2022-12-22 DIAGNOSIS — M1711 Unilateral primary osteoarthritis, right knee: Secondary | ICD-10-CM | POA: Diagnosis not present

## 2022-12-22 DIAGNOSIS — G8929 Other chronic pain: Secondary | ICD-10-CM | POA: Diagnosis not present

## 2022-12-22 NOTE — Progress Notes (Signed)
Sent message, via epic in basket, requesting orders in epic from surgeon.  

## 2022-12-22 NOTE — Patient Instructions (Signed)
SURGICAL WAITING ROOM VISITATION Patients having surgery or a procedure may have no more than 2 support people in the waiting area - these visitors may rotate.    Children under the age of 23 must have an adult with them who is not the patient.  If the patient needs to stay at the hospital during part of their recovery, the visitor guidelines for inpatient rooms apply. Pre-op nurse will coordinate an appropriate time for 1 support person to accompany patient in pre-op.  This support person may not rotate.    Please refer to the [facility] website for the visitor guidelines for Inpatients (after your surgery is over and you are in a regular room).      Your procedure is scheduled on: 01/08/23   Report to [facility] Main Entrance    Report to admitting at : 5:30 AM   Call this number if you have problems the morning of surgery [phone]   Do not eat food :After Midnight.   After Midnight you may have the following liquids until : 5:00 AM DAY OF SURGERY  Water Black Coffee (sugar ok, NO MILK/CREAM OR CREAMERS)  Tea (sugar ok, NO MILK/CREAM OR CREAMERS) regular and decaf                             Plain Jell-O (NO RED)                                           Fruit ices (not with fruit pulp, NO RED)                                     Popsicles (NO RED)                                                                  Juice: apple, WHITE grape, WHITE cranberry Sports drinks like Gatorade (NO RED)           If you have questions, please contact your surgeon's office.   FOLLOW ANY ADDITIONAL PRE OP INSTRUCTIONS YOU RECEIVED FROM YOUR SURGEON'S OFFICE!!!   Oral Hygiene is also important to reduce your risk of infection.                                    Remember - BRUSH YOUR TEETH THE MORNING OF SURGERY WITH YOUR REGULAR TOOTHPASTE  DENTURES WILL BE REMOVED PRIOR TO SURGERY PLEASE DO NOT APPLY "Poly grip" OR ADHESIVES!!!   Do NOT smoke after Midnight   Take these  medicines the morning of surgery with A SIP OF WATER:  Diltaizem Omeprazole Alprazolam Meclizine as needed   Hold Eliquis 3 days before surgery                              You may not have any metal on your body including hair pins, jewelry, and body piercing  Do not wear make-up, lotions, powders, perfumes, or deodorant  Do not wear nail polish including gel and S&S, artificial/acrylic nails, or any other type of covering on natural nails including finger and toenails. If you have artificial nails, gel coating, etc. that needs to be removed by a nail salon please have this removed prior to surgery or surgery may need to be canceled/ delayed if the surgeon/ anesthesia feels like they are unable to be safely monitored.   Do not shave  48 hours prior to surgery.    Do not bring valuables to the hospital. [facility] IS NOT  RESPONSIBLE   FOR VALUABLES.   Contacts, glasses, or bridgework may not be worn into surgery.  DO NOT BRING YOUR HOME MEDICATIONS TO THE HOSPITAL. PHARMACY WILL DISPENSE MEDICATIONS LISTED ON YOUR MEDICATION LIST TO YOU DURING YOUR ADMISSION IN THE HOSPITAL!    Patients discharged on the day of surgery will not be allowed to drive home.  Someone NEEDS to stay with you for the first 24 hours after anesthesia.   Special Instructions: Bring a copy of your healthcare power of attorney and living will documents         the day of surgery if you haven't scanned them before.              Please read over the following fact sheets you were given: IF YOU HAVE QUESTIONS ABOUT YOUR PRE-OP INSTRUCTIONS PLEASE CALL [phone] [place]      Pre-operative 5 CHG Bath Instructions   You can play a key role in reducing the risk of infection after surgery. Your skin needs to be as free of germs as possible. You can reduce the number of germs on your skin by washing with CHG (chlorhexidine gluconate) soap before surgery. CHG is an antiseptic soap that kills germs and  continues to kill germs even after washing.   DO NOT use if you have an allergy to chlorhexidine/CHG or antibacterial soaps. If your skin becomes reddened or irritated, stop using the CHG and notify one of our RNs at : [phone].   Please shower with the CHG soap starting 4 days before surgery using the following schedule:     Please keep in mind the following:  DO NOT shave, including legs and underarms, starting the day of your first shower.   You may shave your face at any point before/day of surgery.  Place clean sheets on your bed the day you start using CHG soap. Use a clean washcloth (not used since being washed) for each shower. DO NOT sleep with pets once you start using the CHG.   CHG Shower Instructions:  If you choose to wash your hair and private area, wash first with your normal shampoo/soap.  After you use shampoo/soap, rinse your hair and body thoroughly to remove shampoo/soap residue.  Turn the water OFF and apply about 3 tablespoons (45 ml) of CHG soap to a CLEAN washcloth.  Apply CHG soap ONLY FROM YOUR NECK DOWN TO YOUR TOES (washing for 3-5 minutes)  DO NOT use CHG soap on face, private areas, open wounds, or sores.  Pay special attention to the area where your surgery is being performed.  If you are having back surgery, having someone wash your back for you may be helpful. Wait 2 minutes after CHG soap is applied, then you may rinse off the CHG soap.  Pat dry with a clean towel  Put on clean clothes/pajamas   If you  choose to wear lotion, please use ONLY the CHG-compatible lotions on the back of this paper.     Additional instructions for the day of surgery: DO NOT APPLY any lotions, deodorants, cologne, or perfumes.   Put on clean/comfortable clothes.  Brush your teeth.  Ask your nurse before applying any prescription medications to the skin.   CHG Compatible Lotions   Aveeno Moisturizing lotion  Cetaphil Moisturizing Cream  Cetaphil Moisturizing  Lotion  Clairol Herbal Essence Moisturizing Lotion, Dry Skin  Clairol Herbal Essence Moisturizing Lotion, Extra Dry Skin  Clairol Herbal Essence Moisturizing Lotion, Normal Skin  Curel Age Defying Therapeutic Moisturizing Lotion with Alpha Hydroxy  Curel Extreme Care Body Lotion  Curel Soothing Hands Moisturizing Hand Lotion  Curel Therapeutic Moisturizing Cream, Fragrance-Free  Curel Therapeutic Moisturizing Lotion, Fragrance-Free  Curel Therapeutic Moisturizing Lotion, Original Formula  Eucerin Daily Replenishing Lotion  Eucerin Dry Skin Therapy Plus Alpha Hydroxy Crme  Eucerin Dry Skin Therapy Plus Alpha Hydroxy Lotion  Eucerin Original Crme  Eucerin Original Lotion  Eucerin Plus Crme Eucerin Plus Lotion  Eucerin TriLipid Replenishing Lotion  Keri Anti-Bacterial Hand Lotion  Keri Deep Conditioning Original Lotion Dry Skin Formula Softly Scented  Keri Deep Conditioning Original Lotion, Fragrance Free Sensitive Skin Formula  Keri Lotion Fast Absorbing Fragrance Free Sensitive Skin Formula  Keri Lotion Fast Absorbing Softly Scented Dry Skin Formula  Keri Original Lotion  Keri Skin Renewal Lotion Keri Silky Smooth Lotion  Keri Silky Smooth Sensitive Skin Lotion  Nivea Body Creamy Conditioning Oil  Nivea Body Extra Enriched Lotion  Nivea Body Original Lotion  Nivea Body Sheer Moisturizing Lotion Nivea Crme  Nivea Skin Firming Lotion  NutraDerm 30 Skin Lotion  NutraDerm Skin Lotion  NutraDerm Therapeutic Skin Cream  NutraDerm Therapeutic Skin Lotion  ProShield Protective Hand Cream  Provon moisturizing lotion   Incentive Spirometer  An incentive spirometer is a tool that can help keep your lungs clear and active. This tool measures how well you are filling your lungs with each breath. Taking long deep breaths may help reverse or decrease the chance of developing breathing (pulmonary) problems (especially infection) following: A long period of time when you are unable to move  or be active. BEFORE THE PROCEDURE  If the spirometer includes an indicator to show your best effort, your nurse or respiratory therapist will set it to a desired goal. If possible, sit up straight or lean slightly forward. Try not to slouch. Hold the incentive spirometer in an upright position. INSTRUCTIONS FOR USE  Sit on the edge of your bed if possible, or sit up as far as you can in bed or on a chair. Hold the incentive spirometer in an upright position. Breathe out normally. Place the mouthpiece in your mouth and seal your lips tightly around it. Breathe in slowly and as deeply as possible, raising the piston or the ball toward the top of the column. Hold your breath for 3-5 seconds or for as long as possible. Allow the piston or ball to fall to the bottom of the column. Remove the mouthpiece from your mouth and breathe out normally. Rest for a few seconds and repeat Steps 1 through 7 at least 10 times every 1-2 hours when you are awake. Take your time and take a few normal breaths between deep breaths. The spirometer may include an indicator to show your best effort. Use the indicator as a goal to work toward during each repetition. After each set of 10 deep breaths, practice coughing to  be sure your lungs are clear. If you have an incision (the cut made at the time of surgery), support your incision when coughing by placing a pillow or rolled up towels firmly against it. Once you are able to get out of bed, walk around indoors and cough well. You may stop using the incentive spirometer when instructed by your caregiver.  RISKS AND COMPLICATIONS Take your time so you do not get dizzy or light-headed. If you are in pain, you may need to take or ask for pain medication before doing incentive spirometry. It is harder to take a deep breath if you are having pain. AFTER USE Rest and breathe slowly and easily. It can be helpful to keep track of a log of your progress. Your caregiver can  provide you with a simple table to help with this. If you are using the spirometer at home, follow these instructions: SEEK MEDICAL CARE IF:  You are having difficultly using the spirometer. You have trouble using the spirometer as often as instructed. Your pain medication is not giving enough relief while using the spirometer. You develop fever of 100.5 F (38.1 C) or higher. SEEK IMMEDIATE MEDICAL CARE IF:  You cough up bloody sputum that had not been present before. You develop fever of 102 F (38.9 C) or greater. You develop worsening pain at or near the incision site. MAKE SURE YOU:  Understand these instructions. Will watch your condition. Will get help right away if you are not doing well or get worse. Document Released: 05/01/2006 Document Revised: 03/13/2011 Document Reviewed: 07/02/2006 [facility] Patient Information 2014 [place], [place].   ________________________________________________________________________

## 2022-12-28 ENCOUNTER — Encounter (HOSPITAL_COMMUNITY)
Admission: RE | Admit: 2022-12-28 | Discharge: 2022-12-28 | Disposition: A | Discharge status: Home / Self Care | Payer: Medicare Other | Source: Ambulatory Visit | Attending: Orthopedic Surgery | Admitting: Orthopedic Surgery

## 2022-12-28 ENCOUNTER — Other Ambulatory Visit: Payer: Self-pay

## 2022-12-28 ENCOUNTER — Encounter (HOSPITAL_COMMUNITY): Payer: Self-pay

## 2022-12-28 VITALS — BP 184/100 | HR 92 | Temp 97.8°F | Resp 20 | Ht 62.0 in | Wt 144.0 lb

## 2022-12-28 DIAGNOSIS — Z7901 Long term (current) use of anticoagulants: Secondary | ICD-10-CM | POA: Diagnosis not present

## 2022-12-28 DIAGNOSIS — K279 Peptic ulcer, site unspecified, unspecified as acute or chronic, without hemorrhage or perforation: Secondary | ICD-10-CM | POA: Insufficient documentation

## 2022-12-28 DIAGNOSIS — F419 Anxiety disorder, unspecified: Secondary | ICD-10-CM | POA: Diagnosis not present

## 2022-12-28 DIAGNOSIS — I080 Rheumatic disorders of both mitral and aortic valves: Secondary | ICD-10-CM | POA: Diagnosis not present

## 2022-12-28 DIAGNOSIS — I119 Hypertensive heart disease without heart failure: Secondary | ICD-10-CM

## 2022-12-28 DIAGNOSIS — I10 Essential (primary) hypertension: Secondary | ICD-10-CM | POA: Insufficient documentation

## 2022-12-28 DIAGNOSIS — I4891 Unspecified atrial fibrillation: Secondary | ICD-10-CM | POA: Insufficient documentation

## 2022-12-28 DIAGNOSIS — Z01818 Encounter for other preprocedural examination: Secondary | ICD-10-CM | POA: Diagnosis not present

## 2022-12-28 DIAGNOSIS — K219 Gastro-esophageal reflux disease without esophagitis: Secondary | ICD-10-CM | POA: Diagnosis not present

## 2022-12-28 HISTORY — DX: Unspecified atrial fibrillation: I48.91

## 2022-12-28 HISTORY — DX: Gastro-esophageal reflux disease without esophagitis: K21.9

## 2022-12-28 LAB — CBC
HCT: 39.8 % (ref 36.0–46.0)
Hemoglobin: 12.9 g/dL (ref 12.0–15.0)
MCH: 29.9 pg (ref 26.0–34.0)
MCHC: 32.4 g/dL (ref 30.0–36.0)
MCV: 92.3 fL (ref 80.0–100.0)
Platelets: 173 10*3/uL (ref 150–400)
RBC: 4.31 MIL/uL (ref 3.87–5.11)
RDW: 14 % (ref 11.5–15.5)
WBC: 5.5 10*3/uL (ref 4.0–10.5)
nRBC: 0 % (ref 0.0–0.2)

## 2022-12-28 LAB — SURGICAL PCR SCREEN
MRSA, PCR: NEGATIVE
Staphylococcus aureus: NEGATIVE

## 2022-12-28 LAB — BASIC METABOLIC PANEL
Anion gap: 10 (ref 5–15)
BUN: 17 mg/dL (ref 8–23)
CO2: 23 mmol/L (ref 22–32)
Calcium: 9.8 mg/dL (ref 8.9–10.3)
Chloride: 106 mmol/L (ref 98–111)
Creatinine, Ser: 0.77 mg/dL (ref 0.44–1.00)
GFR, Estimated: >60 mL/min (ref >60)
Glucose, Bld: 107 mg/dL — ABNORMAL HIGH (ref 70–99)
Potassium: 4.3 mmol/L (ref 3.5–5.1)
Sodium: 139 mmol/L (ref 135–145)

## 2022-12-28 NOTE — Progress Notes (Addendum)
COVID Vaccine Completed:  Date of COVID positive in last 90 days:  No  PCP - Maryjean Ka, MD Cardiologist - Norman Herrlich, MD  Cardiac clearance in Epic dated 12-01-22 Robin Searing, NP  Chest x-ray - N/A EKG - 09-18-22 Epic Stress Test - N/A ECHO - 02-11-21 Epic Cardiac Cath - N/A Pacemaker/ICD device last checked: Spinal Cord Stimulator: N/A Long Term Monitor - 10-14-20 Epic  Bowel Prep - N/A  Sleep Study - N/A CPAP -   Fasting Blood Sugar - N/A Checks Blood Sugar _____ times a day  Last dose of GLP1 agonist-  N/A GLP1 instructions:  Hold 7 days before surgery    Last dose of SGLT-2 inhibitors-  N/A SGLT-2 instructions:  Hold 3 days before surgery    Blood Thinner Instructions:  Eliquis.  Hold x3 days.  Patient and son aware Aspirin Instructions: Last Dose:  Activity level:  Patient states that she does not have to climb stairs, able to perform activities of daily living without stopping and without symptoms of chest pain or shortness of breath.  Patient lives alone  Anesthesia review:  Afib, HTN  BP elevated at PAT, patient very anxious.  Denies any associated symptoms.  Son states that she is normally in the 138/80s range.  He will check BP at home and notify PCP if it remains elevated.    Patient denies shortness of breath, fever, cough and chest pain at PAT appointment  Patient verbalized understanding of instructions that were given to them at the PAT appointment. Patient was also instructed that they will need to review over the PAT instructions again at home before surgery.

## 2022-12-29 NOTE — Progress Notes (Signed)
DISCUSSION: Monica French is an 89 yo female who presents to PAT prior to R TKA on 01/07/22 with Dr. Sherlean Foot for osteoarthritis. PMH of HTN, A.fib on Eliquis, PUD, GERD, anxiety  Patient follows with Cardiology for hx of  chronic AF on Eliquis. Last seen on 09/18/22 by Dr Dulce Sellar. She is rate controlled with CCB. Cannot tolerate BB. Advised to f/u in 6 months. Cleared for surgery:  "Chart reviewed as part of pre-operative protocol coverage. Given past medical history and time since last visit, based on ACC/AHA guidelines, Monica French is at acceptable risk for the planned procedure without further cardiovascular testing.    The patient was advised that if she develops new symptoms prior to surgery to contact our office to arrange for a follow-up visit, and she verbalized understanding. Per office protocol, patient can hold Eliquis for 3 days prior to procedure."  At PAT visit BP noted to be very elevated. Per PAT RN: "BP elevated at PAT, patient very anxious.  Denies any associated symptoms.  Son states that she is normally in the 138/80s range.  He will check BP at home and notify PCP if it remains elevated."   VS: BP (!) 184/100   Pulse 92   Temp 36.6 C (Oral)   Resp 20   Ht 5\' 2"  (1.575 m)   Wt 65.3 kg   SpO2 100%   BMI 26.34 kg/m   PROVIDERS: Street, Stephanie Coup, MD Cardiology: Norman Herrlich, MD  LABS: Labs reviewed: Acceptable for surgery. (all labs ordered are listed, but only abnormal results are displayed)  Labs Reviewed  BASIC METABOLIC PANEL - Abnormal; Notable for the following components:      Result Value   Glucose, Bld 107 (*)    All other components within normal limits  SURGICAL PCR SCREEN  CBC     IMAGES:   EKG:   CV:  Echo 02/11/2021:  IMPRESSIONS    1. Left ventricular ejection fraction, by estimation, is 60 to 65%. The left ventricle has normal function. The left ventricle has no regional wall motion abnormalities. Left ventricular diastolic  function could not be evaluated.  2. Right ventricular systolic function is normal. The right ventricular size is normal. There is normal pulmonary artery systolic pressure.  3. Left atrial size was moderately dilated.  4. Right atrial size was mildly dilated.  5. The mitral valve is normal in structure. Mild to moderate mitral valve regurgitation. No evidence of mitral stenosis.  6. The aortic valve is normal in structure. Aortic valve regurgitation is mild. No aortic stenosis is present.  7. The inferior vena cava is normal in size with greater than 50% respiratory variability, suggesting right atrial pressure of 3 mmHg.  Comparison(s): No significant change from prior study. Prior images reviewed side by side.  Zio patch 10/14/2020:  Patch Wear Time:  6 days and 18 hours (2022-09-30T14:32:36-0400 to 2022-10-07T09:05:28-0400)   Atrial Fibrillation occurred continuously (100% burden), ranging from 42-174 bpm (avg of 82 bpm). Isolated VEs were rare (<1.0%), and no VE Couplets or VE Triplets were present.   The cardiac rhythm throughout was atrial fibrillation The average heart rate 82 bpm Heart rate was in range 50 to 110 bpm 90% of the time day 10% of the time 110 to 151% of the time greater than 150 bpm. Heart rate was in range 5210 bpm 100% of the time at night There were no pauses of 3 seconds or greater and no sustained slow response to atrial fibrillation Ventricular  ectopy was rare without couplets or triplets There was 1 triggered event controlled atrial fibrillation There was 1 diary event with rapid ventricular rate under 112-157 bpm     Conclusion atrial fibrillation in general the heart rate is well controlled.  Past Medical History:  Diagnosis Date   A-fib (HCC)    Anxiety    Arthritis    Gastric ulcer    GERD (gastroesophageal reflux disease)    Hyperlipidemia    Hypertension     Past Surgical History:  Procedure Laterality Date   ABDOMINAL HYSTERECTOMY      CHOLECYSTECTOMY     GYN surgery      MEDICATIONS:  ALPRAZolam (XANAX) 0.25 MG tablet   apixaban (ELIQUIS) 5 MG TABS tablet   Ascorbic Acid (VITAMIN C) 1000 MG tablet   benazepril (LOTENSIN) 40 MG tablet   cholecalciferol (VITAMIN D3) 25 MCG (1000 UNIT) tablet   diltiazem (CARDIZEM) 30 MG tablet   furosemide (LASIX) 20 MG tablet   LYCOPENE PO   meclizine (ANTIVERT) 12.5 MG tablet   Multiple Vitamins-Minerals (CENTRUM SILVER 50+WOMEN) TABS   omeprazole (PRILOSEC) 20 MG capsule   ondansetron (ZOFRAN-ODT) 4 MG disintegrating tablet   potassium chloride SA (KLOR-CON M) 20 MEQ tablet   No current facility-administered medications for this encounter.   Marcille Blanco [facility] Surgical Short Stay/Anesthesiology [facility] Phone [phone] 12/29/2022 8:58 AM

## 2022-12-29 NOTE — Anesthesia Preprocedure Evaluation (Signed)
Anesthesia Evaluation    Airway        Dental   Pulmonary           Cardiovascular hypertension,      Neuro/Psych    GI/Hepatic   Endo/Other    Renal/GU      Musculoskeletal   Abdominal   Peds  Hematology   Anesthesia Other Findings   Reproductive/Obstetrics                              Anesthesia Physical Anesthesia Plan  ASA:   Anesthesia Plan:    Post-op Pain Management:    Induction:   PONV Risk Score and Plan:   Airway Management Planned:   Additional Equipment:   Intra-op Plan:   Post-operative Plan:   Informed Consent:   Plan Discussed with:   Anesthesia Plan Comments: (See PAT note from 12/26 by Sherlie Ban PA-C )         Anesthesia Quick Evaluation

## 2023-01-01 ENCOUNTER — Other Ambulatory Visit: Payer: Self-pay | Admitting: Orthopedic Surgery

## 2023-01-01 DIAGNOSIS — G8929 Other chronic pain: Secondary | ICD-10-CM

## 2023-01-08 ENCOUNTER — Encounter (HOSPITAL_COMMUNITY)
Admission: RE | Disposition: A | Discharge status: Home / Self Care | Payer: Self-pay | Source: Ambulatory Visit | Attending: Orthopedic Surgery

## 2023-01-08 ENCOUNTER — Other Ambulatory Visit: Payer: Self-pay

## 2023-01-08 ENCOUNTER — Ambulatory Visit (HOSPITAL_COMMUNITY): Payer: Medicare Other | Admitting: Medical

## 2023-01-08 ENCOUNTER — Encounter (HOSPITAL_COMMUNITY): Payer: Self-pay | Admitting: Orthopedic Surgery

## 2023-01-08 ENCOUNTER — Ambulatory Visit (HOSPITAL_COMMUNITY): Payer: Medicare Other | Admitting: Anesthesiology

## 2023-01-08 ENCOUNTER — Ambulatory Visit (HOSPITAL_COMMUNITY)
Admission: RE | Admit: 2023-01-08 | Discharge: 2023-01-08 | Disposition: A | Discharge status: Home / Self Care | Payer: Medicare Other | Source: Ambulatory Visit | Attending: Orthopedic Surgery | Admitting: Orthopedic Surgery

## 2023-01-08 DIAGNOSIS — G8929 Other chronic pain: Secondary | ICD-10-CM

## 2023-01-08 DIAGNOSIS — I119 Hypertensive heart disease without heart failure: Secondary | ICD-10-CM | POA: Diagnosis not present

## 2023-01-08 DIAGNOSIS — M1711 Unilateral primary osteoarthritis, right knee: Secondary | ICD-10-CM | POA: Insufficient documentation

## 2023-01-08 DIAGNOSIS — E785 Hyperlipidemia, unspecified: Secondary | ICD-10-CM | POA: Diagnosis not present

## 2023-01-08 DIAGNOSIS — I480 Paroxysmal atrial fibrillation: Secondary | ICD-10-CM | POA: Diagnosis not present

## 2023-01-08 DIAGNOSIS — Z7901 Long term (current) use of anticoagulants: Secondary | ICD-10-CM | POA: Diagnosis not present

## 2023-01-08 DIAGNOSIS — Z79899 Other long term (current) drug therapy: Secondary | ICD-10-CM | POA: Diagnosis not present

## 2023-01-08 DIAGNOSIS — I4819 Other persistent atrial fibrillation: Secondary | ICD-10-CM | POA: Diagnosis not present

## 2023-01-08 DIAGNOSIS — F0394 Unspecified dementia, unspecified severity, with anxiety: Secondary | ICD-10-CM | POA: Insufficient documentation

## 2023-01-08 DIAGNOSIS — F419 Anxiety disorder, unspecified: Secondary | ICD-10-CM | POA: Insufficient documentation

## 2023-01-08 DIAGNOSIS — I10 Essential (primary) hypertension: Secondary | ICD-10-CM | POA: Diagnosis not present

## 2023-01-08 DIAGNOSIS — K219 Gastro-esophageal reflux disease without esophagitis: Secondary | ICD-10-CM | POA: Diagnosis not present

## 2023-01-08 DIAGNOSIS — G8918 Other acute postprocedural pain: Secondary | ICD-10-CM | POA: Diagnosis not present

## 2023-01-08 HISTORY — PX: TOTAL KNEE ARTHROPLASTY: SHX125

## 2023-01-08 SURGERY — ARTHROPLASTY, KNEE, TOTAL
Anesthesia: Spinal | Site: Knee | Laterality: Right

## 2023-01-08 MED ORDER — CHLORHEXIDINE GLUCONATE 0.12 % MT SOLN
15.0000 mL | Freq: Once | OROMUCOSAL | Status: AC
Start: 2023-01-08 — End: 2023-01-08
  Administered 2023-01-08: 15 mL via OROMUCOSAL

## 2023-01-08 MED ORDER — LACTATED RINGERS IV SOLN
INTRAVENOUS | Status: DC
Start: 2023-01-08 — End: 2023-01-08

## 2023-01-08 MED ORDER — PROPOFOL 500 MG/50ML IV EMUL
INTRAVENOUS | Status: DC | PRN
  Administered 2023-01-08: 60 ug/kg/min via INTRAVENOUS

## 2023-01-08 MED ORDER — PHENYLEPHRINE HCL-NACL 20-0.9 MG/250ML-% IV SOLN
INTRAVENOUS | Status: DC | PRN
  Administered 2023-01-08: 50 ug/min via INTRAVENOUS

## 2023-01-08 MED ORDER — FENTANYL CITRATE PF 50 MCG/ML IJ SOSY
25.0000 ug | PREFILLED_SYRINGE | INTRAMUSCULAR | Status: DC | PRN
Start: 2023-01-08 — End: 2023-01-08

## 2023-01-08 MED ORDER — DEXAMETHASONE SODIUM PHOSPHATE 10 MG/ML IJ SOLN
8.0000 mg | Freq: Once | INTRAMUSCULAR | Status: AC
  Administered 2023-01-08: 8 mg via INTRAVENOUS

## 2023-01-08 MED ORDER — GABAPENTIN 300 MG PO CAPS
300.0000 mg | ORAL_CAPSULE | Freq: Once | ORAL | Status: AC
  Administered 2023-01-08: 300 mg via ORAL
  Filled 2023-01-08: qty 1

## 2023-01-08 MED ORDER — ONDANSETRON HCL 4 MG/2ML IJ SOLN
INTRAMUSCULAR | Status: DC | PRN
  Administered 2023-01-08: 4 mg via INTRAVENOUS

## 2023-01-08 MED ORDER — SODIUM CHLORIDE (PF) 0.9 % IJ SOLN
INTRAMUSCULAR | Status: AC
  Filled 2023-01-08: qty 20

## 2023-01-08 MED ORDER — BUPIVACAINE LIPOSOME 1.3 % IJ SUSP
INTRAMUSCULAR | Status: AC
Start: 2023-01-08 — End: ?
  Filled 2023-01-08: qty 20

## 2023-01-08 MED ORDER — DEXAMETHASONE SODIUM PHOSPHATE 10 MG/ML IJ SOLN
INTRAMUSCULAR | Status: AC
  Filled 2023-01-08: qty 1

## 2023-01-08 MED ORDER — PROPOFOL 10 MG/ML IV BOLUS
INTRAVENOUS | Status: DC | PRN
  Administered 2023-01-08: 20 mg via INTRAVENOUS

## 2023-01-08 MED ORDER — TRANEXAMIC ACID-NACL 1000-0.7 MG/100ML-% IV SOLN
1000.0000 mg | INTRAVENOUS | Status: AC
  Administered 2023-01-08: 1000 mg via INTRAVENOUS
  Filled 2023-01-08: qty 100

## 2023-01-08 MED ORDER — FENTANYL CITRATE PF 50 MCG/ML IJ SOSY
100.0000 ug | PREFILLED_SYRINGE | INTRAMUSCULAR | Status: AC
  Administered 2023-01-08: 50 ug via INTRAVENOUS
  Filled 2023-01-08: qty 2

## 2023-01-08 MED ORDER — ACETAMINOPHEN 500 MG PO TABS
1000.0000 mg | ORAL_TABLET | Freq: Once | ORAL | Status: AC
  Administered 2023-01-08: 1000 mg via ORAL
  Filled 2023-01-08: qty 2

## 2023-01-08 MED ORDER — WATER FOR IRRIGATION, STERILE IR SOLN
Status: DC | PRN
  Administered 2023-01-08: 1000 mL

## 2023-01-08 MED ORDER — LACTATED RINGERS IV BOLUS
250.0000 mL | Freq: Once | INTRAVENOUS | Status: AC
  Administered 2023-01-08: 250 mL via INTRAVENOUS

## 2023-01-08 MED ORDER — BUPIVACAINE LIPOSOME 1.3 % IJ SUSP: 20.0000 mL | Freq: Once | INTRAMUSCULAR | Status: DC

## 2023-01-08 MED ORDER — BUPIVACAINE LIPOSOME 1.3 % IJ SUSP
INTRAMUSCULAR | Status: DC | PRN
  Administered 2023-01-08: 20 mL

## 2023-01-08 MED ORDER — ROPIVACAINE HCL 5 MG/ML IJ SOLN
INTRAMUSCULAR | Status: DC | PRN
  Administered 2023-01-08: 20 mL via PERINEURAL

## 2023-01-08 MED ORDER — ORAL CARE MOUTH RINSE: 15.0000 mL | Freq: Once | OROMUCOSAL | Status: AC

## 2023-01-08 MED ORDER — ONDANSETRON HCL 4 MG/2ML IJ SOLN: 4.0000 mg | Freq: Once | INTRAMUSCULAR | Status: DC | PRN

## 2023-01-08 MED ORDER — OXYCODONE HCL 5 MG PO TABS
5.0000 mg | ORAL_TABLET | ORAL | Status: DC | PRN
Start: 2023-01-08 — End: 2023-01-08
  Administered 2023-01-08: 5 mg via ORAL

## 2023-01-08 MED ORDER — LACTATED RINGERS IV BOLUS
500.0000 mL | Freq: Once | INTRAVENOUS | Status: AC
  Administered 2023-01-08: 500 mL via INTRAVENOUS

## 2023-01-08 MED ORDER — SODIUM CHLORIDE 0.9% FLUSH
INTRAVENOUS | Status: DC | PRN
  Administered 2023-01-08: 20 mL

## 2023-01-08 MED ORDER — PHENYLEPHRINE HCL-NACL 20-0.9 MG/250ML-% IV SOLN
INTRAVENOUS | Status: AC
  Filled 2023-01-08: qty 250

## 2023-01-08 MED ORDER — ONDANSETRON HCL 4 MG/2ML IJ SOLN
INTRAMUSCULAR | Status: AC
  Filled 2023-01-08: qty 2

## 2023-01-08 MED ORDER — DEXAMETHASONE SODIUM PHOSPHATE 10 MG/ML IJ SOLN
INTRAMUSCULAR | Status: DC | PRN
Start: 2023-01-08 — End: 2023-01-08
  Administered 2023-01-08: 10 mg

## 2023-01-08 MED ORDER — BUPIVACAINE-EPINEPHRINE 0.25% -1:200000 IJ SOLN
INTRAMUSCULAR | Status: DC | PRN
  Administered 2023-01-08: 30 mL

## 2023-01-08 MED ORDER — POVIDONE-IODINE 10 % EX SWAB
2.0000 | Freq: Once | CUTANEOUS | Status: AC
  Administered 2023-01-08: 2 via TOPICAL

## 2023-01-08 MED ORDER — SODIUM CHLORIDE 0.9 % IR SOLN
Status: DC | PRN
  Administered 2023-01-08: 1000 mL

## 2023-01-08 MED ORDER — OXYCODONE HCL 5 MG PO TABS: 5.0000 mg | ORAL_TABLET | Freq: Four times a day (QID) | ORAL | 0 refills | Status: AC | PRN

## 2023-01-08 MED ORDER — PROPOFOL 1000 MG/100ML IV EMUL
INTRAVENOUS | Status: AC
  Filled 2023-01-08: qty 100

## 2023-01-08 MED ORDER — MIDAZOLAM HCL 2 MG/2ML IJ SOLN
2.0000 mg | INTRAMUSCULAR | Status: DC
  Filled 2023-01-08: qty 2

## 2023-01-08 MED ORDER — CEFAZOLIN SODIUM-DEXTROSE 2-4 GM/100ML-% IV SOLN
2.0000 g | INTRAVENOUS | Status: AC
  Administered 2023-01-08: 2 g via INTRAVENOUS
  Filled 2023-01-08: qty 100

## 2023-01-08 MED ORDER — OXYCODONE HCL 5 MG PO TABS
ORAL_TABLET | ORAL | Status: AC
  Filled 2023-01-08: qty 1

## 2023-01-08 MED ORDER — BUPIVACAINE-EPINEPHRINE 0.25% -1:200000 IJ SOLN
INTRAMUSCULAR | Status: AC
  Filled 2023-01-08: qty 1

## 2023-01-08 MED ORDER — HYDROMORPHONE HCL 1 MG/ML IJ SOLN: 0.5000 mg | INTRAMUSCULAR | Status: DC | PRN

## 2023-01-08 SURGICAL SUPPLY — 46 items
ARTISURF 10M PLY R 6-9EF KNEE (Knees) IMPLANT
BAG COUNTER SPONGE SURGICOUNT (BAG) IMPLANT
BAG ZIPLOCK 12X15 (MISCELLANEOUS) ×1 IMPLANT
BLADE SAGITTAL 13X1.27X60 (BLADE) ×1 IMPLANT
BLADE SAW SGTL 18X1.27X75 (BLADE) ×1 IMPLANT
BLADE SURG 15 STRL LF DISP TIS (BLADE) ×1 IMPLANT
BNDG ELASTIC 6INX 5YD STR LF (GAUZE/BANDAGES/DRESSINGS) ×1 IMPLANT
BOWL SMART MIX CTS (DISPOSABLE) ×1 IMPLANT
CEMENT BONE R 1X40 (Cement) ×2 IMPLANT
COVER SURGICAL LIGHT HANDLE (MISCELLANEOUS) ×1 IMPLANT
CUFF TRNQT CYL 34X4.125X (TOURNIQUET CUFF) ×1 IMPLANT
DRAPE INCISE IOBAN 66X45 STRL (DRAPES) ×2 IMPLANT
DRAPE U-SHAPE 47X51 STRL (DRAPES) ×1 IMPLANT
DRSG AQUACEL AG ADV 3.5X10 (GAUZE/BANDAGES/DRESSINGS) ×1 IMPLANT
DURAPREP 26ML APPLICATOR (WOUND CARE) ×2 IMPLANT
ELECT REM PT RETURN 15FT ADLT (MISCELLANEOUS) ×1 IMPLANT
FEMUR CMT CCR STD SZ7 R KNEE (Knees) ×1 IMPLANT
FEMUR CMTD CCR STD SZ7 R KNEE (Knees) IMPLANT
GLOVE BIOGEL M 7.0 STRL (GLOVE) IMPLANT
GLOVE BIOGEL PI IND STRL 7.5 (GLOVE) IMPLANT
GLOVE BIOGEL PI IND STRL 8.5 (GLOVE) ×1 IMPLANT
GLOVE SURG ORTHO 8.0 STRL STRW (GLOVE) ×2 IMPLANT
GOWN STRL REUS W/ TWL XL LVL3 (GOWN DISPOSABLE) ×2 IMPLANT
HOLDER FOLEY CATH W/STRAP (MISCELLANEOUS) ×1 IMPLANT
HOOD PEEL AWAY T7 (MISCELLANEOUS) ×3 IMPLANT
KIT TURNOVER KIT A (KITS) IMPLANT
MANIFOLD NEPTUNE II (INSTRUMENTS) ×1 IMPLANT
NS IRRIG 1000ML POUR BTL (IV SOLUTION) ×1 IMPLANT
PACK TOTAL KNEE CUSTOM (KITS) ×1 IMPLANT
PROTECTOR NERVE ULNAR (MISCELLANEOUS) ×1 IMPLANT
SET HNDPC FAN SPRY TIP SCT (DISPOSABLE) ×1 IMPLANT
SPIKE FLUID TRANSFER (MISCELLANEOUS) ×2 IMPLANT
STEM POLY PAT PLY 35M KNEE (Knees) IMPLANT
STEM TIBIA 5 DEG SZ E R KNEE (Knees) IMPLANT
STRIP CLOSURE SKIN 1/2X4 (GAUZE/BANDAGES/DRESSINGS) ×1 IMPLANT
SUT BONE WAX W31G (SUTURE) ×1 IMPLANT
SUT MNCRL AB 3-0 PS2 18 (SUTURE) ×1 IMPLANT
SUT STRATAFIX 0 PDS 27 VIOLET (SUTURE) ×1
SUT STRATAFIX 1PDS 45CM VIOLET (SUTURE) ×1 IMPLANT
SUT VIC AB 1 CT1 36 (SUTURE) ×1 IMPLANT
SUTURE STRATFX 0 PDS 27 VIOLET (SUTURE) ×1 IMPLANT
TIBIA STEM 5 DEG SZ E R KNEE (Knees) ×1 IMPLANT
TRAY FOLEY MTR SLVR 16FR STAT (SET/KITS/TRAYS/PACK) ×1 IMPLANT
TUBE SUCTION HIGH CAP CLEAR NV (SUCTIONS) ×1 IMPLANT
WATER STERILE IRR 1000ML POUR (IV SOLUTION) ×2 IMPLANT
WRAP KNEE MAXI GEL POST OP (GAUZE/BANDAGES/DRESSINGS) ×1 IMPLANT

## 2023-01-08 NOTE — H&P (Signed)
 Monica French MRN:  [id number] DOB/SEX:  09-11-1931/female  CHIEF COMPLAINT:  Painful right Knee  HISTORY: Patient is a 89 y.o. female presented with a history of pain in the right knee. Onset of symptoms was gradual starting a few years ago with gradually worsening course since that time. Patient has been treated conservatively with over-the-counter NSAIDs and activity modification. Patient currently rates pain in the knee at 10 out of 10 with activity. There is pain at night.  PAST MEDICAL HISTORY: Patient Active Problem List   Diagnosis Date Noted   Dementia (HCC) 02/13/2021   Dizziness 02/12/2021   Resistant hypertension 02/11/2021   AKI (acute kidney injury) (HCC) 02/11/2021   Hyperbilirubinemia 02/11/2021   Hyponatremia 02/10/2021   Anxiety 09/15/2020   Arthritis 09/15/2020   Gastric ulcer 09/15/2020   Hyperlipidemia 09/15/2020   Hypertensive heart disease 09/15/2020   Persistent atrial fibrillation (HCC) 09/13/2020   Paroxysmal atrial fibrillation (HCC) 09/09/2020   Secondary hypercoagulable state (HCC) 09/09/2020   Chronic pain of both knees 07/13/2020   Past Medical History:  Diagnosis Date   A-fib (HCC)    Anxiety    Arthritis    Gastric ulcer    GERD (gastroesophageal reflux disease)    Hyperlipidemia    Hypertension    Past Surgical History:  Procedure Laterality Date   ABDOMINAL HYSTERECTOMY     CHOLECYSTECTOMY     GYN surgery       MEDICATIONS:   Medications Prior to Admission  Medication Sig Dispense Refill Last Dose/Taking   ALPRAZolam  (XANAX ) 0.25 MG tablet Take 0.25 mg by mouth 2 (two) times daily as needed for anxiety.   01/08/2023 Morning   apixaban  (ELIQUIS ) 5 MG TABS tablet Take 1 tablet (5 mg total) by mouth 2 (two) times daily. 180 tablet 1 01/04/2023 at  6:00 PM   Ascorbic Acid  (VITAMIN C) 1000 MG tablet Take 1,000 mg by mouth daily.   Past Week   benazepril  (LOTENSIN ) 40 MG tablet Take 40 mg by mouth every morning.   01/07/2023    cholecalciferol  (VITAMIN D3) 25 MCG (1000 UNIT) tablet Take 1,000 Units by mouth daily.   Past Week   diltiazem  (CARDIZEM ) 30 MG tablet Take 1 tablet (30 mg total) by mouth 3 (three) times daily. 270 tablet 3 01/08/2023 Morning   LYCOPENE PO Take 1 tablet by mouth daily.   Past Week   meclizine  (ANTIVERT ) 12.5 MG tablet Take 1 tablet (12.5 mg total) by mouth 3 (three) times daily as needed for dizziness. 30 tablet 0 Taking As Needed   Multiple Vitamins-Minerals (CENTRUM SILVER 50+WOMEN) TABS Take 1 tablet by mouth daily.   Past Week   omeprazole  (PRILOSEC) 20 MG capsule Take 1 capsule (20 mg total) by mouth daily. 90 capsule 3 01/08/2023 Morning   potassium chloride  SA (KLOR-CON  M) 20 MEQ tablet Take 20 mEq by mouth every other day.   Past Week   furosemide  (LASIX ) 20 MG tablet Please take furosemide  every other day as needed 45 tablet 3 Unknown   ondansetron  (ZOFRAN -ODT) 4 MG disintegrating tablet 4mg  ODT q4 hours prn nausea/vomit (Patient not taking: Reported on 12/21/2022) 10 tablet 0 Not Taking    ALLERGIES:   Allergies  Allergen Reactions   Codeine Nausea And Vomiting   Triamterene Other (See Comments)    Hyponatremia (low sodium)    REVIEW OF SYSTEMS:  A comprehensive review of systems was negative except for: Musculoskeletal: positive for arthralgias and bone pain   FAMILY HISTORY:   Family  History  Problem Relation Age of Onset   Heart disease Mother    Heart attack Father    Heart disease Brother    Cancer - Lung Brother     SOCIAL HISTORY:   Social History   Tobacco Use   Smoking status: Never    Passive exposure: Never   Smokeless tobacco: Never  Substance Use Topics   Alcohol use: Never     EXAMINATION:  Vital signs in last 24 hours: Temp:  [97.6 F (36.4 C)] 97.6 F (36.4 C) (01/06 0634) Pulse Rate:  [96] 96 (01/06 0634) Resp:  [16] 16 (01/06 0634) BP: (153)/(72) 153/72 (01/06 0634) SpO2:  [99 %] 99 % (01/06 0634)  BP (!) 153/72   Pulse 96   Temp 97.6  F (36.4 C) (Oral)   Resp 16   SpO2 99%   General Appearance:    Alert, cooperative, no distress, appears stated age  Head:    Normocephalic, without obvious abnormality, atraumatic  Eyes:    PERRL, conjunctiva/corneas clear, EOM's intact, fundi    benign, both eyes  Ears:    Normal TM's and external ear canals, both ears  Nose:   Nares normal, septum midline, mucosa normal, no drainage    or sinus tenderness  Throat:   Lips, mucosa, and tongue normal; teeth and gums normal  Neck:   Supple, symmetrical, trachea midline, no adenopathy;    thyroid :  no enlargement/tenderness/nodules; no carotid   bruit or JVD  Back:     Symmetric, no curvature, ROM normal, no CVA tenderness  Lungs:     Clear to auscultation bilaterally, respirations unlabored  Chest Wall:    No tenderness or deformity   Heart:    Regular rate and rhythm, S1 and S2 normal, no murmur, rub   or gallop  Breast Exam:    No tenderness, masses, or nipple abnormality  Abdomen:     Soft, non-tender, bowel sounds active all four quadrants,    no masses, no organomegaly  Genitalia:    Normal female without lesion, discharge or tenderness  Rectal:    Normal tone, no masses or tenderness;   guaiac negative stool  Extremities:   Extremities normal, atraumatic, no cyanosis or edema  Pulses:   2+ and symmetric all extremities  Skin:   Skin color, texture, turgor normal, no rashes or lesions  Lymph nodes:   Cervical, supraclavicular, and axillary nodes normal  Neurologic:   CNII-XII intact, normal strength, sensation and reflexes    throughout    Musculoskeletal:  ROM 0-120, Ligaments intact,  Imaging Review Plain radiographs demonstrate severe degenerative joint disease of the right knee. The overall alignment is neutral. The bone quality appears to be good for age and reported activity level.  Assessment/Plan: Primary osteoarthritis, right knee   The patient history, physical examination and imaging studies are consistent with  advanced degenerative joint disease of the right knee. The patient has failed conservative treatment.  The clearance notes were reviewed.  After discussion with the patient it was felt that Total Knee Replacement was indicated. The procedure,  risks, and benefits of total knee arthroplasty were presented and reviewed. The risks including but not limited to aseptic loosening, infection, blood clots, vascular injury, stiffness, patella tracking problems complications among others were discussed. The patient acknowledged the explanation, agreed to proceed with the plan.  Preoperative templating of the joint replacement has been completed, documented, and submitted to the Operating Room personnel in order to optimize intra-operative equipment management.  Patient's anticipated LOS is less than 2 midnights, meeting these requirements: - Lives within 1 hour of care - Has a competent adult at home to recover with post-op recover - NO history of  - Chronic pain requiring opiods  - Diabetes  - Coronary Artery Disease  - Heart failure  - Heart attack  - Stroke  - DVT/VTE  - Cardiac arrhythmia  - Respiratory Failure/COPD  - Renal failure  - Anemia  - Advanced Liver disease     Laurell Dale Simpers 01/08/2023, 7:03 AM

## 2023-01-08 NOTE — Evaluation (Signed)
 Physical Therapy Evaluation Patient Details Name: Monica French MRN: [id number] DOB: 1931-10-20 Today's Date: 01/08/2023  History of Present Illness  89 yo female presents to therapy s/p R TKA on 01/08/2023 due to failure of conservative measures. Pt PMH Includes but is not limited to: dementia, HTN, PAF, chronic pain, AKI, gastric ulcer, GERD, HLD and arthritis.  Clinical Impression   Monica French is a 89 y.o. female POD 0 s/p R TKA. Patient reports mod I with mobility at baseline. Patient is now limited by functional impairments (see PT problem list below) and requires CGA for transfers and gait with RW. Patient was able to ambulate 40 feet x 2 with RW and CGA and cues for safe walker management. Patient educated on safe sequencing for stair mobility,  fall risk prevention, use of RW, pain management and goal, use of CP/ice, car transfers and slowly increasing activity pt and family and verbalized safe guarding position for people assisting with mobility. Patient instructed in exercises to facilitate ROM and circulation reviewed and HO provided. Patient will benefit from continued skilled PT interventions to address impairments and progress towards PLOF. Patient has met mobility goals at adequate level for discharge home with family support and OPPT services; will continue to follow if pt continues acute stay to progress towards Mod I goals.       If plan is discharge home, recommend the following: A little help with walking and/or transfers;A little help with bathing/dressing/bathroom;Assistance with cooking/housework;Help with stairs or ramp for entrance;Assist for transportation   Can travel by private vehicle        Equipment Recommendations None recommended by PT  Recommendations for Other Services       Functional Status Assessment Patient has had a recent decline in their functional status and demonstrates the ability to make significant improvements in function in a reasonable  and predictable amount of time.     Precautions / Restrictions Precautions Precautions: Knee;Fall Restrictions Weight Bearing Restrictions Per Provider Order: No      Mobility  Bed Mobility Overal bed mobility: Needs Assistance Bed Mobility: Supine to Sit     Supine to sit: HOB elevated, Contact guard     General bed mobility comments: min cues    Transfers Overall transfer level: Needs assistance Equipment used: Rolling walker (2 wheels) Transfers: Sit to/from Stand Sit to Stand: Contact guard assist           General transfer comment: min cues for proper UE placement    Ambulation/Gait Ambulation/Gait assistance: Contact guard assist Gait Distance (Feet): 40 Feet Assistive device: Rolling walker (2 wheels) Gait Pattern/deviations: Step-to pattern, Antalgic, Trunk flexed Gait velocity: decreased     General Gait Details: min cues for safety and proper RW placement  Stairs Stairs: Yes Stairs assistance: Contact guard assist Stair Management: Two rails Number of Stairs: 3 General stair comments: cues for safety and sequencing with pt making attempts to lead with R LE ascending and L descending with cues provided and son observing and will be able to assist pt in/out of the home. pt and family ed on use of RW for navigating steps to enter home  Wheelchair Mobility     Tilt Bed    Modified Rankin (Stroke Patients Only)       Balance Overall balance assessment: Needs assistance Sitting-balance support: Feet supported Sitting balance-Leahy Scale: Good     Standing balance support: Bilateral upper extremity supported, During functional activity, Reliant on assistive device for balance Standing balance-Leahy Scale:  Fair Standing balance comment: static standing no UE support                             Pertinent Vitals/Pain Pain Assessment Pain Assessment: 0-10 Pain Score: 3  Pain Location: R knee Pain Descriptors / Indicators:  Aching, Sore, Constant, Discomfort, Dull, Operative site guarding Pain Intervention(s): Limited activity within patient's tolerance, Monitored during session, Premedicated before session, Repositioned, Ice applied    Home Living Family/patient expects to be discharged to:: Private residence Living Arrangements: Alone Available Help at Discharge: Family (son) Type of Home: House Home Access: Stairs to enter Entrance Stairs-Rails: None (post to hold onto, nonsequential steps) Entrance Stairs-Number of Steps: 2   Home Layout: One level Home Equipment: Agricultural Consultant (2 wheels);Shower seat - built in;Cane - single point      Prior Function Prior Level of Function : Independent/Modified Independent             Mobility Comments: intermittent use of SPC for mobility tasks, furniture walked in home, mod I for ADLs, self care tasks, IADLs, driving       Extremity/Trunk Assessment        Lower Extremity Assessment Lower Extremity Assessment: RLE deficits/detail RLE Deficits / Details: ankle DF/PF 5/5; SLR < 10 degree lag RLE Sensation: WNL    Cervical / Trunk Assessment Cervical / Trunk Assessment: Normal  Communication   Communication Communication: No apparent difficulties  Cognition Arousal: Alert Behavior During Therapy: WFL for tasks assessed/performed Overall Cognitive Status: Within Functional Limits for tasks assessed                                          General Comments      Exercises Total Joint Exercises Ankle Circles/Pumps: AROM, Both, 10 reps Quad Sets: AROM, Right, 5 reps Short Arc Quad: AROM, Right, 5 reps Heel Slides: AROM, Right, 5 reps Hip ABduction/ADduction: AROM, Right, 5 reps Straight Leg Raises: AROM, Right, 5 reps Knee Flexion: AROM, Right, 5 reps, Seated   Assessment/Plan    PT Assessment Patient needs continued PT services  PT Problem List Decreased strength;Decreased range of motion;Decreased activity  tolerance;Decreased mobility;Decreased balance;Decreased coordination;Pain       PT Treatment Interventions DME instruction;Gait training;Stair training;Functional mobility training;Therapeutic activities;Therapeutic exercise;Balance training;Patient/family education;Neuromuscular re-education;Modalities    PT Goals (Current goals can be found in the Care Plan section)  Acute Rehab PT Goals Patient Stated Goal: to be able to do all the things that I have not been able to do, garden, go to the beach and fish PT Goal Formulation: With patient Time For Goal Achievement: 01/22/23 Potential to Achieve Goals: Good    Frequency 7X/week     Co-evaluation               AM-PAC PT 6 Clicks Mobility  Outcome Measure Help needed turning from your back to your side while in a flat bed without using bedrails?: A Little Help needed moving from lying on your back to sitting on the side of a flat bed without using bedrails?: A Little Help needed moving to and from a bed to a chair (including a wheelchair)?: A Little Help needed standing up from a chair using your arms (e.g., wheelchair or bedside chair)?: A Little Help needed to walk in hospital room?: A Little Help needed climbing 3-5 steps with a  railing? : A Little 6 Click Score: 18    End of Session Equipment Utilized During Treatment: Gait belt Activity Tolerance: Patient tolerated treatment well;No increased pain Patient left: in chair;with chair alarm set;with family/visitor present Nurse Communication: Mobility status;Other (comment) (pt readiness for same day d/c from PT perspective) PT Visit Diagnosis: Unsteadiness on feet (R26.81);Other abnormalities of gait and mobility (R26.89);Muscle weakness (generalized) (M62.81);Difficulty in walking, not elsewhere classified (R26.2);Pain Pain - Right/Left: Right Pain - part of body: Leg;Knee    Time: 8490-8441 PT Time Calculation (min) (ACUTE ONLY): 49 min   Charges:   PT  Evaluation $PT Eval Low Complexity: 1 Low PT Treatments $Gait Training: 8-22 mins $Therapeutic Exercise: 8-22 mins PT General Charges $$ ACUTE PT VISIT: 1 Visit         [place], PT Acute Rehab   [place] VEAR Drone 01/08/2023, 5:21 PM

## 2023-01-08 NOTE — Progress Notes (Signed)
 Orthopedic Tech Progress Note Patient Details:  Monica French 03-Nov-1931 [id number]  Ortho Devices Type of Ortho Device: Bone foam zero knee Ortho Device/Splint Location: RLE Ortho Device/Splint Interventions: Ordered, Application, Adjustment   Post Interventions Patient Tolerated: Well Instructions Provided: Care of device  [name] A Wonda 01/08/2023, 10:17 AM

## 2023-01-08 NOTE — Transfer of Care (Signed)
 Immediate Anesthesia Transfer of Care Note  Patient: Monica French  Procedure(s) Performed: TOTAL KNEE ARTHROPLASTY (Right: Knee)  Patient Location: PACU  Anesthesia Type:Spinal  Level of Consciousness: sedated  Airway & Oxygen Therapy: Patient Spontanous Breathing and Patient connected to face mask oxygen  Post-op Assessment: Report given to RN and Post -op Vital signs reviewed and stable  Post vital signs: Reviewed and stable  Last Vitals:  Vitals Value Taken Time  BP 112/64 01/08/23 1001  Temp    Pulse 42 01/08/23 1003  Resp 17 01/08/23 1003  SpO2 97 % 01/08/23 1003  Vitals shown include unfiled device data.  Last Pain:  Vitals:   01/08/23 0707  TempSrc:   PainSc: 0-No pain         Complications: No notable events documented.

## 2023-01-08 NOTE — Op Note (Signed)
 TOTAL KNEE REPLACEMENT OPERATIVE NOTE:  01/08/2023  11:21 AM  PATIENT:  Monica French  89 y.o. female  PRE-OPERATIVE DIAGNOSIS:  Osteoarthritis of right knee  M17.11  POST-OPERATIVE DIAGNOSIS:  Osteoarthritis of right knee M17.11  PROCEDURE:  Procedure(s): TOTAL KNEE ARTHROPLASTY  SURGEON:  Surgeon(s): Rubie Kemps, MD  PHYSICIAN ASSISTANT:  Almeda Rummer, PA-C  ANESTHESIA:   spinal  SPECIMEN: None  COUNTS:  Correct  TOURNIQUET:   Total Tourniquet Time Documented: Thigh (Right) - 35 minutes Total: Thigh (Right) - 35 minutes   DICTATION:  Indication for procedure:    The patient is a 89 y.o. female who has failed conservative treatment for Osteoarthritis of right knee  M17.11.  Informed consent was obtained prior to anesthesia. The risks versus benefits of the operation were explain and in a way the patient can, and did, understand.    Description of procedure:     The patient was taken to the operating room and placed under anesthesia.  The patient was positioned in the usual fashion taking care that all body parts were adequately padded and/or protected.  A tourniquet was applied and the leg prepped and draped in the usual sterile fashion.  The extremity was exsanguinated with the esmarch and tourniquet inflated to 250 mmHg.  Pre-operative range of motion was normal.    A midline incision approximately 6-7 inches long was made with a #10 blade.  A new blade was used to make a parapatellar arthrotomy going 2-3 cm into the quadriceps tendon, over the patella, and alongside the medial aspect of the patellar tendon.  A synovectomy was then performed with the #10 blade and forceps. I then elevated the deep MCL off the medial tibial metaphysis subperiosteally around to the semimembranosus attachment.    I everted the patella and used calipers to measure patellar thickness.  I used the reamer to ream down to appropriate thickness to recreate the native thickness.  I then  removed excess bone with the rongeur and sagittal saw.  I used the appropriately sized template and drilled the three lug holes.  I then put the trial in place and measured the thickness with the calipers to ensure recreation of the native thickness.  The trial was then removed and the patella subluxed and the knee brought into flexion.  A homan retractor was place to retract and protect the patella and lateral structures.  A Z-retractor was place medially to protect the medial structures.  The extra-medullary alignment system was used to make cut the tibial articular surface perpendicular to the anamotic axis of the tibia and in 3 degrees of posterior slope.  The cut surface and alignment jig was removed.  I then used the intramedullary alignment guide to make a valgus cut on the distal femur.  I then marked out the epicondylar axis on the distal femur.   I then used the anterior referencing sizer and measured the femur to be a size 7.  The 4-In-1 cutting block was screwed into place in external rotation matching the posterior condylar angle, making our cuts perpendicular to the epicondylar axis.  Anterior, posterior and chamfer cuts were made with the sagittal saw.  The cutting block and cut pieces were removed.  A lamina spreader was placed in 90 degrees of flexion.  The ACL, PCL, menisci, and posterior condylar osteophytes were removed.  A 10 mm spacer blocked was found to offer good flexion and extension gap balance after minimal in degree releasing.   The scoop retractor  was then placed and the femoral finishing block was pinned in place.  The small sagittal saw was used as well as the lug drill to finish the femur.  The block and cut surfaces were removed and the medullary canal hole filled with autograft bone from the cut pieces.  The tibia was delivered forward in deep flexion and external rotation.  A size E tray was selected and pinned into place centered on the medial 1/3 of the tibial tubercle.   The reamer and keel was used to prepare the tibia through the tray.    I then trialed with the size 7 femur, size E tibia, a 10 mm insert and the 35 patella.  I had excellent flexion/extension gap balance, excellent patella tracking.  Flexion was full and beyond 120 degrees; extension was zero.  These components were chosen and the staff opened them to me on the back table while the knee was lavaged copiously and the cement mixed.  The soft tissue was infiltrated with 60cc of exparel  1.3% through a 21 gauge needle.  I cemented in the components and removed all excess cement.  The polyethylene tibial component was snapped into place and the knee placed in extension while cement was hardening.  The capsule was infilltrated with a 60cc exparel /marcaine /saline mixture.   Once the cement was hard, the tourniquet was let down.  Hemostasis was obtained.  The arthrotomy was closed using a #1 stratofix running suture.  The deep soft tissues were closed with #0 vicryls and the subcuticular layer closed with #2-0 vicryl.  The skin was reapproximated and closed with 3.0 Monocryl.  The wound was covered with steristrips, aquacel dressing, and a TED stocking.   The patient was then awakened, extubated, and taken to the recovery room in stable condition.  BLOOD LOSS:  300cc COMPLICATIONS:  None.  PLAN OF CARE: Discharge to home after PACU  PATIENT DISPOSITION:  PACU - hemodynamically stable.   Delay start of Pharmacological VTE agent (>24hrs) due to surgical blood loss or risk of bleeding:  yes  Please fax a copy of this op note to my office at [phone] (please only include page 1 and 2 of the Case Information op note)

## 2023-01-08 NOTE — Anesthesia Procedure Notes (Signed)
 Anesthesia Regional Block: Adductor canal block   Pre-Anesthetic Checklist: , timeout performed,  Correct Patient, Correct Site, Correct Laterality,  Correct Procedure, Correct Position, site marked,  Risks and benefits discussed,  Surgical consent,  Pre-op evaluation,  At surgeon's request and post-op pain management  Laterality: Right  Prep: chloraprep       Needles:  Injection technique: Single-shot  Needle Type: Echogenic Needle     Needle Length: 9cm  Needle Gauge: 21     Additional Needles:   Procedures:,,,, ultrasound used (permanent image in chart),,    Narrative:  Start time: 01/08/2023 7:59 AM End time: 01/08/2023 8:06 AM Injection made incrementally with aspirations every 5 mL.  Performed by: Personally  Anesthesiologist: Corinne Garnette BRAVO, MD  Additional Notes: No pain on injection. No increased resistance to injection. Injection made in 5cc increments.  Good needle visualization.  Patient tolerated procedure well.

## 2023-01-08 NOTE — Anesthesia Procedure Notes (Signed)
 Spinal  Patient location during procedure: OR Start time: 01/08/2023 8:15 AM End time: 01/08/2023 8:22 AM Reason for block: surgical anesthesia Staffing Performed: anesthesiologist  Anesthesiologist: Corinne Garnette BRAVO, MD Performed by: Corinne Garnette BRAVO, MD Authorized by: Corinne Garnette BRAVO, MD   Preanesthetic Checklist Completed: patient identified, IV checked, risks and benefits discussed, surgical consent, monitors and equipment checked, pre-op evaluation and timeout performed Spinal Block Patient position: sitting Prep: DuraPrep and site prepped and draped Patient monitoring: continuous pulse ox and blood pressure Approach: midline Location: L3-4 Injection technique: single-shot Needle Needle type: Pencan  Needle gauge: 24 G Assessment Events: CSF return and second provider Additional Notes Functioning IV was confirmed and monitors were applied. Sterile prep and drape, including hand hygiene, mask and sterile gloves were used. The patient was positioned and the spine was prepped. The skin was anesthetized with lidocaine.  Free flow of clear CSF was obtained prior to injecting local anesthetic into the CSF.  The spinal needle aspirated freely following injection.  The needle was carefully withdrawn.  The patient tolerated the procedure well. Consent was obtained prior to procedure with all questions answered and concerns addressed. Risks including but not limited to bleeding, infection, nerve damage, paralysis, failed block, inadequate analgesia, allergic reaction, high spinal, itching and headache were discussed and the patient wished to proceed.   Garnette Corinne, MD

## 2023-01-09 ENCOUNTER — Encounter (HOSPITAL_COMMUNITY): Payer: Self-pay | Admitting: Orthopedic Surgery

## 2023-01-09 NOTE — Anesthesia Postprocedure Evaluation (Signed)
 Anesthesia Post Note  Patient: CHANDLAR STAEBELL  Procedure(s) Performed: TOTAL KNEE ARTHROPLASTY (Right: Knee)     Patient location during evaluation: PACU Anesthesia Type: Spinal Level of consciousness: awake, awake and alert and oriented Pain management: pain level controlled Vital Signs Assessment: post-procedure vital signs reviewed and stable Respiratory status: spontaneous breathing, nonlabored ventilation and respiratory function stable Cardiovascular status: blood pressure returned to baseline and stable Postop Assessment: no headache, no backache, spinal receding and no apparent nausea or vomiting Anesthetic complications: no   No notable events documented.  Last Vitals:    Last Pain:                 Garnette FORBES [place]

## 2023-01-18 DIAGNOSIS — M62551 Muscle wasting and atrophy, not elsewhere classified, right thigh: Secondary | ICD-10-CM | POA: Diagnosis not present

## 2023-01-18 DIAGNOSIS — Z96651 Presence of right artificial knee joint: Secondary | ICD-10-CM | POA: Diagnosis not present

## 2023-01-18 DIAGNOSIS — R2689 Other abnormalities of gait and mobility: Secondary | ICD-10-CM | POA: Diagnosis not present

## 2023-01-18 DIAGNOSIS — M25561 Pain in right knee: Secondary | ICD-10-CM | POA: Diagnosis not present

## 2023-01-22 DIAGNOSIS — M25561 Pain in right knee: Secondary | ICD-10-CM | POA: Diagnosis not present

## 2023-01-22 DIAGNOSIS — M62551 Muscle wasting and atrophy, not elsewhere classified, right thigh: Secondary | ICD-10-CM | POA: Diagnosis not present

## 2023-01-22 DIAGNOSIS — R2689 Other abnormalities of gait and mobility: Secondary | ICD-10-CM | POA: Diagnosis not present

## 2023-01-25 DIAGNOSIS — M25561 Pain in right knee: Secondary | ICD-10-CM | POA: Diagnosis not present

## 2023-01-25 DIAGNOSIS — R2689 Other abnormalities of gait and mobility: Secondary | ICD-10-CM | POA: Diagnosis not present

## 2023-01-25 DIAGNOSIS — M62551 Muscle wasting and atrophy, not elsewhere classified, right thigh: Secondary | ICD-10-CM | POA: Diagnosis not present

## 2023-01-29 DIAGNOSIS — R2689 Other abnormalities of gait and mobility: Secondary | ICD-10-CM | POA: Diagnosis not present

## 2023-01-29 DIAGNOSIS — M25561 Pain in right knee: Secondary | ICD-10-CM | POA: Diagnosis not present

## 2023-01-29 DIAGNOSIS — M62551 Muscle wasting and atrophy, not elsewhere classified, right thigh: Secondary | ICD-10-CM | POA: Diagnosis not present

## 2023-01-31 DIAGNOSIS — M25561 Pain in right knee: Secondary | ICD-10-CM | POA: Diagnosis not present

## 2023-01-31 DIAGNOSIS — R2689 Other abnormalities of gait and mobility: Secondary | ICD-10-CM | POA: Diagnosis not present

## 2023-01-31 DIAGNOSIS — M62551 Muscle wasting and atrophy, not elsewhere classified, right thigh: Secondary | ICD-10-CM | POA: Diagnosis not present

## 2023-02-05 DIAGNOSIS — M62551 Muscle wasting and atrophy, not elsewhere classified, right thigh: Secondary | ICD-10-CM | POA: Diagnosis not present

## 2023-02-05 DIAGNOSIS — M25561 Pain in right knee: Secondary | ICD-10-CM | POA: Diagnosis not present

## 2023-02-05 DIAGNOSIS — R2689 Other abnormalities of gait and mobility: Secondary | ICD-10-CM | POA: Diagnosis not present

## 2023-02-07 DIAGNOSIS — M62551 Muscle wasting and atrophy, not elsewhere classified, right thigh: Secondary | ICD-10-CM | POA: Diagnosis not present

## 2023-02-07 DIAGNOSIS — R2689 Other abnormalities of gait and mobility: Secondary | ICD-10-CM | POA: Diagnosis not present

## 2023-02-07 DIAGNOSIS — M25561 Pain in right knee: Secondary | ICD-10-CM | POA: Diagnosis not present

## 2023-02-10 DIAGNOSIS — R300 Dysuria: Secondary | ICD-10-CM | POA: Diagnosis not present

## 2023-02-10 DIAGNOSIS — N3000 Acute cystitis without hematuria: Secondary | ICD-10-CM | POA: Diagnosis not present

## 2023-02-14 DIAGNOSIS — M62551 Muscle wasting and atrophy, not elsewhere classified, right thigh: Secondary | ICD-10-CM | POA: Diagnosis not present

## 2023-02-14 DIAGNOSIS — R2689 Other abnormalities of gait and mobility: Secondary | ICD-10-CM | POA: Diagnosis not present

## 2023-02-14 DIAGNOSIS — M25561 Pain in right knee: Secondary | ICD-10-CM | POA: Diagnosis not present

## 2023-02-19 DIAGNOSIS — M62551 Muscle wasting and atrophy, not elsewhere classified, right thigh: Secondary | ICD-10-CM | POA: Diagnosis not present

## 2023-02-19 DIAGNOSIS — M25561 Pain in right knee: Secondary | ICD-10-CM | POA: Diagnosis not present

## 2023-02-19 DIAGNOSIS — R2689 Other abnormalities of gait and mobility: Secondary | ICD-10-CM | POA: Diagnosis not present

## 2023-02-21 DIAGNOSIS — M62551 Muscle wasting and atrophy, not elsewhere classified, right thigh: Secondary | ICD-10-CM | POA: Diagnosis not present

## 2023-02-21 DIAGNOSIS — R2689 Other abnormalities of gait and mobility: Secondary | ICD-10-CM | POA: Diagnosis not present

## 2023-02-21 DIAGNOSIS — M25561 Pain in right knee: Secondary | ICD-10-CM | POA: Diagnosis not present

## 2023-02-26 DIAGNOSIS — M25561 Pain in right knee: Secondary | ICD-10-CM | POA: Diagnosis not present

## 2023-02-26 DIAGNOSIS — M62551 Muscle wasting and atrophy, not elsewhere classified, right thigh: Secondary | ICD-10-CM | POA: Diagnosis not present

## 2023-02-26 DIAGNOSIS — R2689 Other abnormalities of gait and mobility: Secondary | ICD-10-CM | POA: Diagnosis not present

## 2023-02-28 DIAGNOSIS — R2689 Other abnormalities of gait and mobility: Secondary | ICD-10-CM | POA: Diagnosis not present

## 2023-02-28 DIAGNOSIS — M62551 Muscle wasting and atrophy, not elsewhere classified, right thigh: Secondary | ICD-10-CM | POA: Diagnosis not present

## 2023-02-28 DIAGNOSIS — M25561 Pain in right knee: Secondary | ICD-10-CM | POA: Diagnosis not present

## 2023-03-07 ENCOUNTER — Other Ambulatory Visit: Payer: Self-pay | Admitting: Cardiology

## 2023-04-12 ENCOUNTER — Ambulatory Visit: Payer: Medicare Other | Admitting: Cardiology

## 2023-04-12 ENCOUNTER — Ambulatory Visit

## 2023-04-26 DIAGNOSIS — Z96651 Presence of right artificial knee joint: Secondary | ICD-10-CM | POA: Diagnosis not present

## 2023-05-02 IMAGING — MR MR HEAD W/O CM
12 of 13 series · 44 of 48 positions shown · non-contrast
Comparison: Head CT from 2 days ago

CLINICAL DATA: Dizziness, persistent or recurrent with cardiac or
vascular cause suspected. History of atrial fibrillation on Eliquis.

EXAM:
MRI HEAD WITHOUT CONTRAST
TECHNIQUE: Multiplanar, multiecho pulse sequences of the brain and surrounding
structures were obtained without intravenous contrast.

[Series 5: DWI · axial · 3.0mm · 0.88mm/px · z∈[-123,+24]mm · 8 of 104 slices shown (1 of 4)]
[im 1/104]
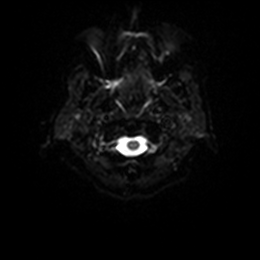
[im 15/104]
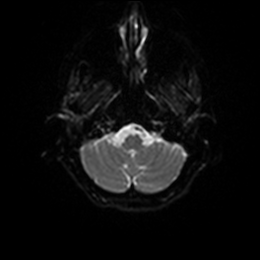
[im 30/104]
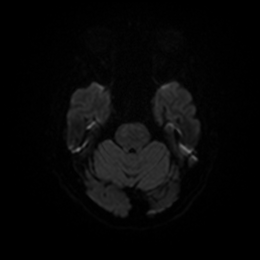
[im 45/104]
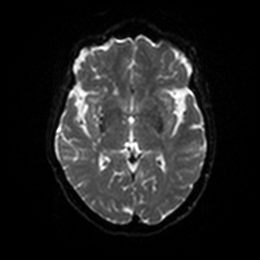
[im 59/104]
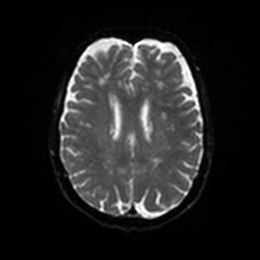
[im 74/104]
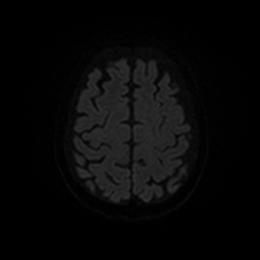
[im 89/104]
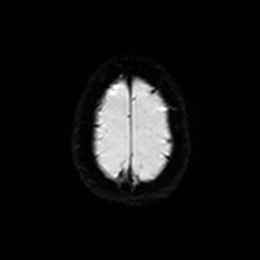
[im 104/104]
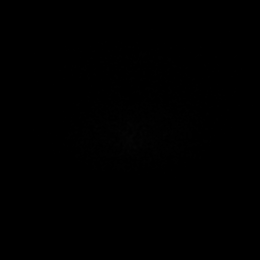

[Series 6: DWI · axial · 3.0mm · 0.88mm/px · z∈[-123,+24]mm · 4 of 51 slices shown (2 of 4)]
[im 1/51]
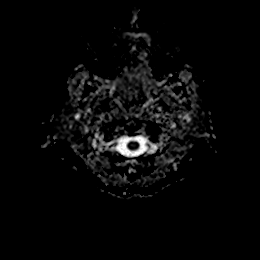
[im 17/51]
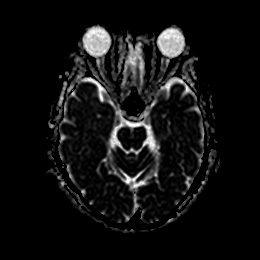
[im 34/51]
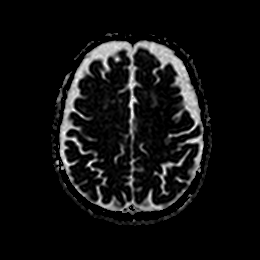
[im 51/51]
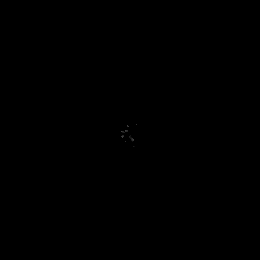

[Series 7: DWI · coronal · 4.0mm · 0.88mm/px · 6 of 74 slices shown (3 of 4)]
[im 1/74]
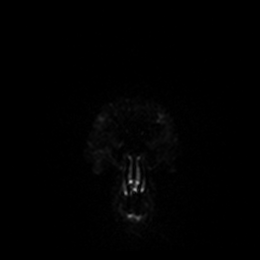
[im 15/74]
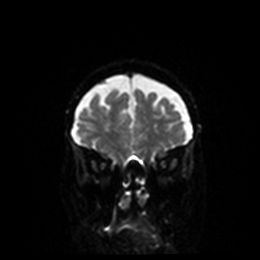
[im 30/74]
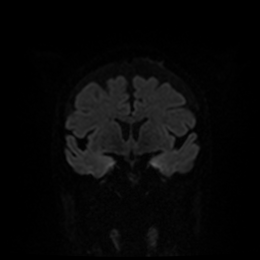
[im 44/74]
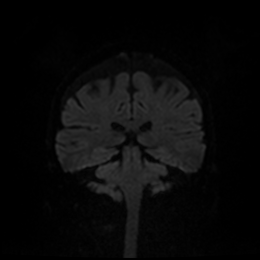
[im 59/74]
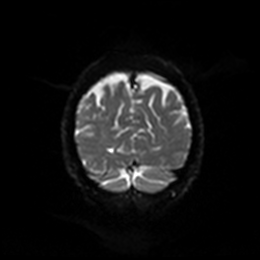
[im 74/74]
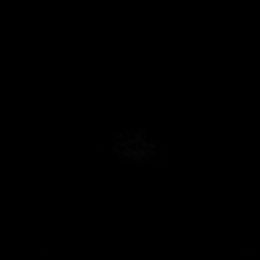

[Series 8: DWI · coronal · 4.0mm · 0.88mm/px · 3 of 37 slices shown (4 of 4)]
[im 1/37]
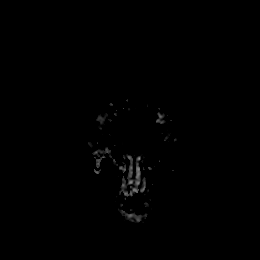
[im 19/37]
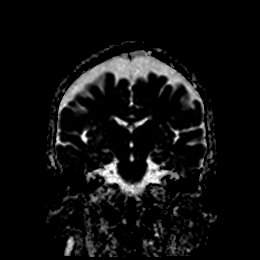
[im 37/37]
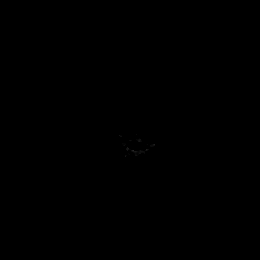

[Series 9: T1 · sagittal · 5.0mm · 0.75mm/px · 2 of 25 slices shown]
[im 1/25]
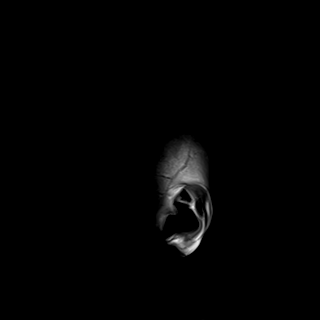
[im 25/25]
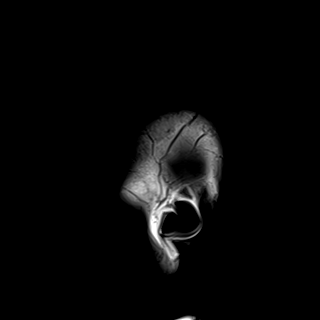

[Series 10: T2 · axial · 5.0mm · 0.72mm/px · z∈[-121,+22]mm · 2 of 26 slices shown (1 of 2)]
[im 1/26]
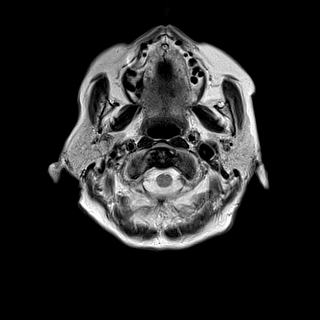
[im 26/26]
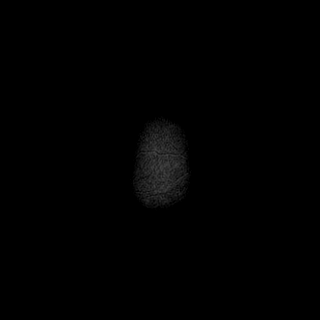

[Series 11: FLAIR · axial · 5.0mm · 0.45mm/px · z∈[-122,+22]mm · 2 of 26 slices shown]
[im 1/26]
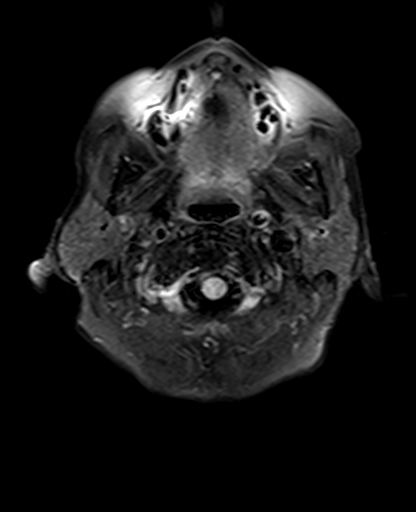
[im 26/26]
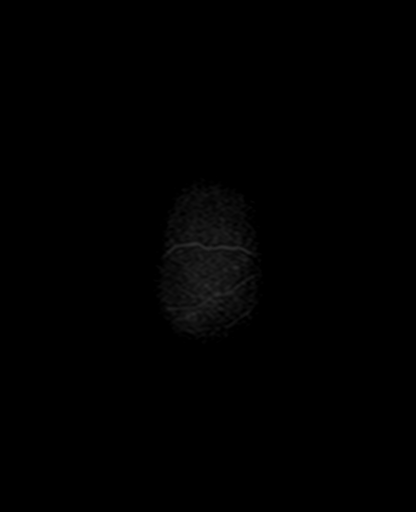

[Series 12: mag_images · axial · 3.0mm · 0.90mm/px · z∈[-123,+23]mm · 4 of 52 slices shown]
[im 1/52]
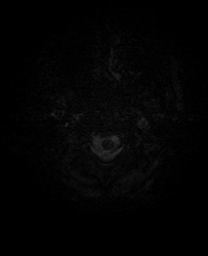
[im 18/52]
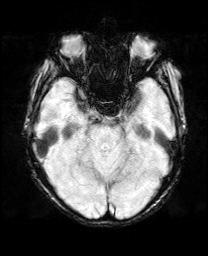
[im 35/52]
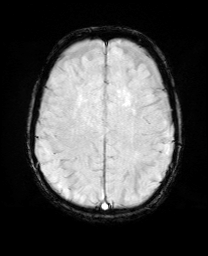
[im 52/52]
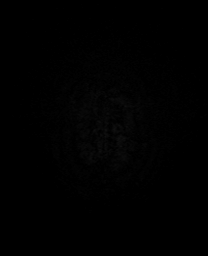

[Series 13: pha_images · axial · 3.0mm · 0.90mm/px · z∈[-123,+20]mm · 4 of 51 slices shown]
[im 1/51]
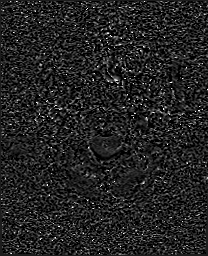
[im 17/51]
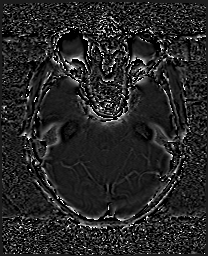
[im 34/51]
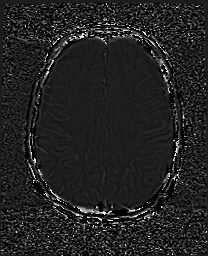
[im 51/51]
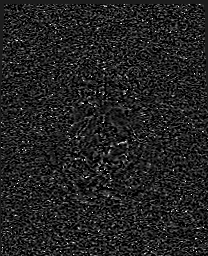

[Series 14: swi_images · axial · 3.0mm · 0.90mm/px · z∈[-123,+23]mm · 4 of 52 slices shown]
[im 1/52]
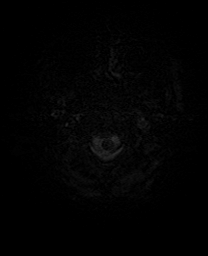
[im 18/52]
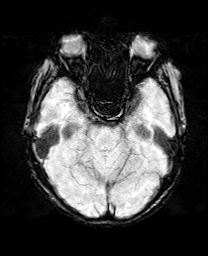
[im 35/52]
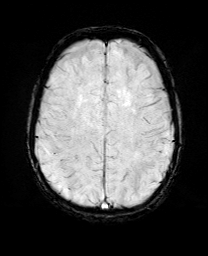
[im 52/52]
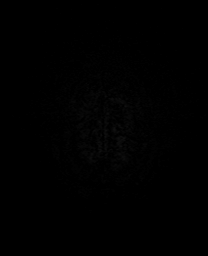

[Series 15: mip_images(sw) · axial · 24.0mm · 0.90mm/px · z∈[-113,+13]mm · 3 of 45 slices shown]
[im 1/45]
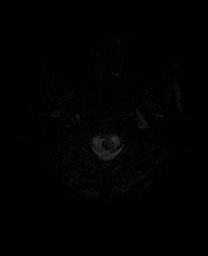
[im 23/45]
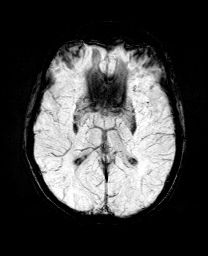
[im 45/45]
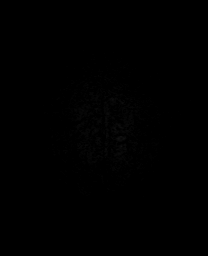

[Series 17: T2 · coronal · 5.0mm · 0.34mm/px · 2 of 30 slices shown (2 of 2)]
[im 1/30]
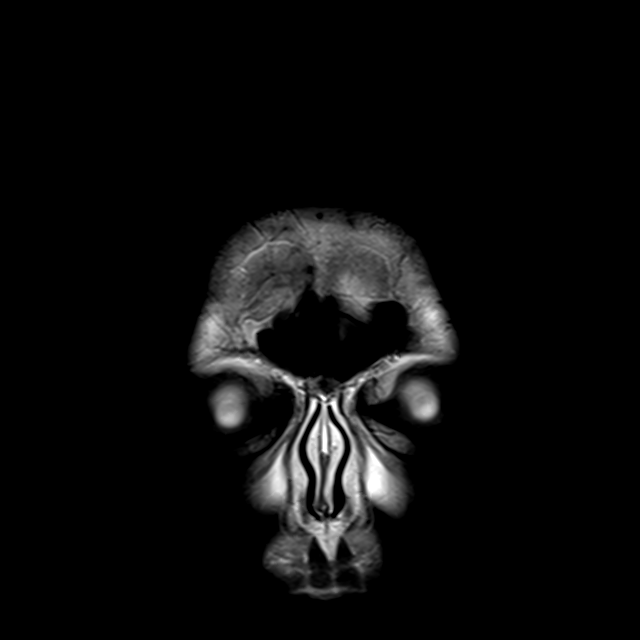
[im 30/30]
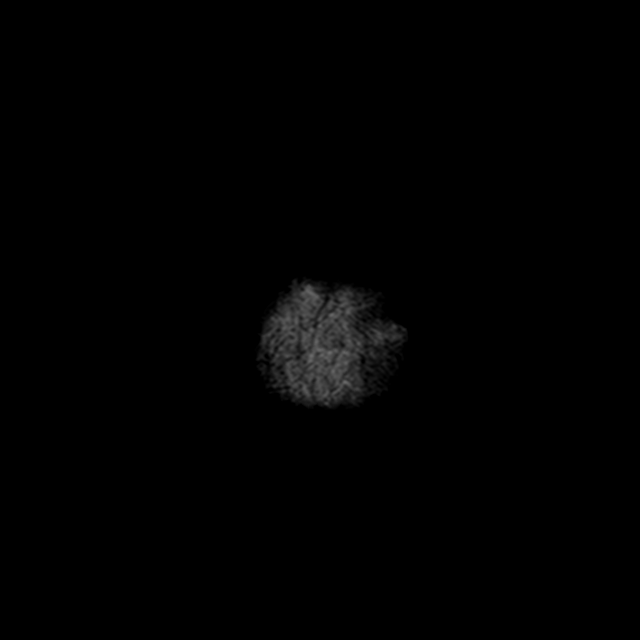

[44 of 48 positions shown; findings below may reference images not displayed]

FINDINGS: Brain: Prominent extra-axial subarachnoid spaces without cortical
mass effect or vessel displacement. No acute infarct, hemorrhage,
hydrocephalus, or collection. Mild for age chronic small vessel
ischemia. Dilated perivascular space below the right putamen.

Vascular: Normal flow voids.

Skull and upper cervical spine: Normal marrow signal.

Sinuses/Orbits: Negative.
IMPRESSION: Age congruent senescent changes.  No acute or reversible finding.

## 2023-05-17 DIAGNOSIS — I700 Atherosclerosis of aorta: Secondary | ICD-10-CM | POA: Diagnosis not present

## 2023-05-17 DIAGNOSIS — D6869 Other thrombophilia: Secondary | ICD-10-CM | POA: Diagnosis not present

## 2023-05-17 DIAGNOSIS — Z131 Encounter for screening for diabetes mellitus: Secondary | ICD-10-CM | POA: Diagnosis not present

## 2023-05-17 DIAGNOSIS — G720 Drug-induced myopathy: Secondary | ICD-10-CM | POA: Diagnosis not present

## 2023-05-17 DIAGNOSIS — E785 Hyperlipidemia, unspecified: Secondary | ICD-10-CM | POA: Diagnosis not present

## 2023-05-17 DIAGNOSIS — I672 Cerebral atherosclerosis: Secondary | ICD-10-CM | POA: Diagnosis not present

## 2023-05-17 DIAGNOSIS — I10 Essential (primary) hypertension: Secondary | ICD-10-CM | POA: Diagnosis not present

## 2023-05-17 DIAGNOSIS — Z79899 Other long term (current) drug therapy: Secondary | ICD-10-CM | POA: Diagnosis not present

## 2023-05-17 DIAGNOSIS — Z0000 Encounter for general adult medical examination without abnormal findings: Secondary | ICD-10-CM | POA: Diagnosis not present

## 2023-05-17 DIAGNOSIS — I482 Chronic atrial fibrillation, unspecified: Secondary | ICD-10-CM | POA: Diagnosis not present

## 2023-05-17 DIAGNOSIS — M353 Polymyalgia rheumatica: Secondary | ICD-10-CM | POA: Diagnosis not present

## 2023-05-17 DIAGNOSIS — M1991 Primary osteoarthritis, unspecified site: Secondary | ICD-10-CM | POA: Diagnosis not present

## 2023-05-31 ENCOUNTER — Encounter: Payer: Self-pay | Admitting: Cardiology

## 2023-06-22 DIAGNOSIS — I10 Essential (primary) hypertension: Secondary | ICD-10-CM | POA: Insufficient documentation

## 2023-06-22 DIAGNOSIS — I4891 Unspecified atrial fibrillation: Secondary | ICD-10-CM | POA: Insufficient documentation

## 2023-06-22 DIAGNOSIS — K219 Gastro-esophageal reflux disease without esophagitis: Secondary | ICD-10-CM | POA: Insufficient documentation

## 2023-06-25 ENCOUNTER — Ambulatory Visit

## 2023-06-25 ENCOUNTER — Ambulatory Visit: Admitting: Cardiology

## 2023-06-25 VITALS — BP 164/78 | HR 100 | Ht 62.0 in | Wt 140.8 lb

## 2023-06-25 DIAGNOSIS — R42 Dizziness and giddiness: Secondary | ICD-10-CM

## 2023-06-25 DIAGNOSIS — E785 Hyperlipidemia, unspecified: Secondary | ICD-10-CM | POA: Diagnosis not present

## 2023-06-25 DIAGNOSIS — F419 Anxiety disorder, unspecified: Secondary | ICD-10-CM

## 2023-06-25 DIAGNOSIS — I4821 Permanent atrial fibrillation: Secondary | ICD-10-CM

## 2023-06-25 DIAGNOSIS — N179 Acute kidney failure, unspecified: Secondary | ICD-10-CM

## 2023-06-25 DIAGNOSIS — I10 Essential (primary) hypertension: Secondary | ICD-10-CM

## 2023-06-25 DIAGNOSIS — E871 Hypo-osmolality and hyponatremia: Secondary | ICD-10-CM

## 2023-06-25 DIAGNOSIS — I1A0 Resistant hypertension: Secondary | ICD-10-CM

## 2023-06-25 DIAGNOSIS — G8929 Other chronic pain: Secondary | ICD-10-CM

## 2023-06-25 DIAGNOSIS — M1990 Unspecified osteoarthritis, unspecified site: Secondary | ICD-10-CM

## 2023-06-25 DIAGNOSIS — I4819 Other persistent atrial fibrillation: Secondary | ICD-10-CM

## 2023-06-25 DIAGNOSIS — D6869 Other thrombophilia: Secondary | ICD-10-CM

## 2023-06-25 DIAGNOSIS — I480 Paroxysmal atrial fibrillation: Secondary | ICD-10-CM

## 2023-06-25 DIAGNOSIS — F0390 Unspecified dementia without behavioral disturbance: Secondary | ICD-10-CM

## 2023-06-25 DIAGNOSIS — K259 Gastric ulcer, unspecified as acute or chronic, without hemorrhage or perforation: Secondary | ICD-10-CM

## 2023-06-25 DIAGNOSIS — I119 Hypertensive heart disease without heart failure: Secondary | ICD-10-CM

## 2023-06-25 NOTE — Progress Notes (Signed)
 Cardiology Consultation:    Date:  06/25/2023   ID:  Monica French, DOB 1931/07/17, MRN [id number]  PCP:  Street, Lonni HERO, MD  Cardiologist:  Alean SAUNDERS Krysta Bloomfield, MD   Referring MD: Street, Lonni HERO, *   No chief complaint on file.    ASSESSMENT AND PLAN:   [name] 89 year old woman with history of permanent atrial fibrillation on rate control strategy, hypertension, peptic ulcer disease,  preserved LVEF 60 to 65% February 2023 by echocardiogram, moderate MR, mild aortic insufficiency.  Recently underwent right total knee replacement in January 2025. Did not tolerate beta-blockers for rate control.   Problem List Items Addressed This Visit     Permanent atrial fibrillation (HCC) - Primary   Permanent atrial fibrillation on rate control strategy. Remains asymptomatic, remains in atrial fibrillation today, rates controlled. Continue rate control with diltiazem  30 mg 3 times daily. Did not tolerate beta-blockers well in the past.  CHA2DS2-VASc score at least 4. Remains on anticoagulation with Eliquis  5 mg twice daily [dose appropriate given her weight and recent creatinine levels from May 17, 2023 0.8].        Relevant Medications   cloNIDine  (CATAPRES ) 0.1 MG tablet   Hyperlipidemia   Relevant Medications   cloNIDine  (CATAPRES ) 0.1 MG tablet   Hypertension   Elevated blood pressures today in the office. Considering advanced age Target blood pressures below 150/90 mmHg  Mentions pretty well-controlled at her routine follow-up visits. Continue benazepril  40 mg every morning. She continues to take diltiazem  for rate control and will continue this for her blood pressure management as well. Remains on clonidine  0.1 mg at bedtime.       Relevant Medications   cloNIDine  (CATAPRES ) 0.1 MG tablet   Return to clinic tentatively in 6 months   History of Present Illness:    Monica French is a 89 y.o. female who is being seen today for follow-up  visit. PCP is Street, [place], *. Last visit with us  in the office was 09/18/2022.  Pleasant woman here for the visit today by herself.  Lives by herself at home.  Mentions her son lives nearby and helps her out. She routinely follows up with Dr. Monetta at our office and is excited to share that she will be celebrating her birthday this weekend.  History of permanent atrial fibrillation, hypertension, peptic ulcer disease,  preserved LVEF 60 to 65% February 2023 by echocardiogram, moderate MR, mild aortic insufficiency.  Recently underwent right total knee replacement in January 2025. Did not tolerate beta-blockers for rate control.  Mentions overall she has been doing well.  Feels her blood pressures today in the office being elevated is an outlier as they were recently well-controlled at PCPs follow-up visit.  Denies any blood in urine or stools. Denies any chest pain or orthopnea. Mentions when she lays on her left side she can be aware of her heartbeat but denies any fast heartbeat or discomfort.  Denies any lightheadedness, dizziness or syncopal episodes. Mentions good compliance with her medications including Eliquis , benazepril , diltiazem , clonidine . Mentions she has not been taking Lasix .  Blood work from 05-17-2023 through PCPs office note total cholesterol 235, HDL 61, LDL 162, triglycerides 143. Hemoglobin A1c 5.9. Hemoglobin 11.9 Creatinine 0.8  Past Medical History:  Diagnosis Date   A-fib (HCC)    AKI (acute kidney injury) (HCC) 02/11/2021   Anxiety    Arthritis    Chronic pain of both knees 07/13/2020   Dementia (HCC) 02/13/2021  Dizziness 02/12/2021   Gastric ulcer    GERD (gastroesophageal reflux disease)    Hyperbilirubinemia 02/11/2021   Hyperlipidemia    Hypertension    Hypertensive heart disease 09/15/2020   Hyponatremia 02/10/2021   Paroxysmal atrial fibrillation (HCC) 09/09/2020   Persistent atrial fibrillation (HCC) 09/13/2020   Resistant  hypertension 02/11/2021   Secondary hypercoagulable state (HCC) 09/09/2020    Past Surgical History:  Procedure Laterality Date   ABDOMINAL HYSTERECTOMY     CHOLECYSTECTOMY     GYN surgery     TOTAL KNEE ARTHROPLASTY Right 01/08/2023   Procedure: TOTAL KNEE ARTHROPLASTY;  Surgeon: Rubie Kemps, MD;  Location: [facility];  Service: Orthopedics;  Laterality: Right;    Current Medications: Current Meds  Medication Sig   ALPRAZolam  (XANAX ) 0.25 MG tablet Take 0.25 mg by mouth 2 (two) times daily as needed for anxiety.   apixaban  (ELIQUIS ) 5 MG TABS tablet Take 1 tablet (5 mg total) by mouth 2 (two) times daily.   Ascorbic Acid  (VITAMIN C) 1000 MG tablet Take 1,000 mg by mouth daily.   benazepril  (LOTENSIN ) 40 MG tablet Take 40 mg by mouth every morning.   cholecalciferol  (VITAMIN D3) 25 MCG (1000 UNIT) tablet Take 1,000 Units by mouth daily.   cloNIDine  (CATAPRES ) 0.1 MG tablet Take 0.1 mg by mouth at bedtime.   diltiazem  (CARDIZEM ) 30 MG tablet Take 1 tablet (30 mg total) by mouth 3 (three) times daily.   furosemide  (LASIX ) 20 MG tablet Please take furosemide  every other day as needed   LYCOPENE PO Take 1 tablet by mouth daily.   meclizine  (ANTIVERT ) 12.5 MG tablet Take 1 tablet (12.5 mg total) by mouth 3 (three) times daily as needed for dizziness.   Multiple Vitamins-Minerals (CENTRUM SILVER 50+WOMEN) TABS Take 1 tablet by mouth daily.   omeprazole  (PRILOSEC) 20 MG capsule Take 1 capsule (20 mg total) by mouth daily.   ondansetron  (ZOFRAN -ODT) 4 MG disintegrating tablet 4mg  ODT q4 hours prn nausea/vomit   oxyCODONE  (OXY IR/ROXICODONE ) 5 MG immediate release tablet Take 1-2 tablets (5-10 mg total) by mouth every 6 (six) hours as needed.   potassium chloride  SA (KLOR-CON  M) 20 MEQ tablet Take 20 mEq by mouth every other day.     Allergies:   Codeine and Triamterene   Social History   Socioeconomic History   Marital status: Widowed    Spouse name: Not on file   Number of children: Not  on file   Years of education: Not on file   Highest education level: Not on file  Occupational History   Not on file  Tobacco Use   Smoking status: Never    Passive exposure: Never   Smokeless tobacco: Never  Vaping Use   Vaping status: Never Used  Substance and Sexual Activity   Alcohol use: Never   Drug use: Never   Sexual activity: Not on file  Other Topics Concern   Not on file  Social History Narrative   Not on file   Social Drivers of Health   Financial Resource Strain: Not on file  Food Insecurity: Not on file  Transportation Needs: Not on file  Physical Activity: Not on file  Stress: Not on file  Social Connections: Not on file     Family History: The patient's family history includes Cancer - Lung in her brother; Heart attack in her father; Heart disease in her brother and mother. ROS:   Please see the history of present illness.    All 14 point  review of systems negative except as described per history of present illness.  EKGs/Labs/Other Studies Reviewed:    The following studies were reviewed today:   EKG:       Recent Labs: 08/21/2022: B Natriuretic Peptide 567.4 09/18/2022: Magnesium  2.1 12/28/2022: BUN 17; Creatinine, Ser 0.77; Hemoglobin 12.9; Platelets 173; Potassium 4.3; Sodium 139  Recent Lipid Panel No results found for: CHOL, TRIG, HDL, CHOLHDL, VLDL, LDLCALC, LDLDIRECT  Physical Exam:    VS:  BP (!) 164/78   Pulse 100   Ht 5' 2 (1.575 m)   Wt 140 lb 12.8 oz (63.9 kg)   SpO2 97%   BMI 25.75 kg/m     Wt Readings from Last 3 Encounters:  06/25/23 140 lb 12.8 oz (63.9 kg)  01/08/23 144 lb (65.3 kg)  12/28/22 144 lb (65.3 kg)     GENERAL:  Well nourished, well developed in no acute distress NECK: No JVD; No carotid bruits CARDIAC: Irregularly irregular, S1 and S2 present, no murmurs, no rubs, no gallops CHEST:  Clear to auscultation without rales, wheezing or rhonchi  Extremities: No pitting pedal edema. Pulses  bilaterally symmetric with radial 2+ and dorsalis pedis 1+.   NEUROLOGIC:  Alert and oriented x 3  Medication Adjustments/Labs and Tests Ordered: Current medicines are reviewed at length with the patient today.  Concerns regarding medicines are outlined above.  No orders of the defined types were placed in this encounter.  No orders of the defined types were placed in this encounter.   Signed, Alean jess Kobus, MD, MPH, [facility]. 06/25/2023 5:24 PM    [facility]

## 2023-06-25 NOTE — Assessment & Plan Note (Signed)
 Elevated blood pressures today in the office. Considering advanced age Target blood pressures below 150/90 mmHg  Mentions pretty well-controlled at her routine follow-up visits. Continue benazepril  40 mg every morning. She continues to take diltiazem  for rate control and will continue this for her blood pressure management as well. Remains on clonidine  0.1 mg at bedtime.

## 2023-06-25 NOTE — Patient Instructions (Signed)

## 2023-06-25 NOTE — Assessment & Plan Note (Addendum)
 Permanent atrial fibrillation on rate control strategy. Remains asymptomatic, remains in atrial fibrillation today, rates controlled. Continue rate control with diltiazem  30 mg 3 times daily. Did not tolerate beta-blockers well in the past.  CHA2DS2-VASc score at least 4. Remains on anticoagulation with Eliquis  5 mg twice daily [dose appropriate given her weight and recent creatinine levels from May 17, 2023 0.8].

## 2023-09-05 ENCOUNTER — Other Ambulatory Visit: Payer: Self-pay | Admitting: Cardiology

## 2023-09-26 ENCOUNTER — Other Ambulatory Visit: Payer: Self-pay | Admitting: Cardiology

## 2023-10-19 ENCOUNTER — Emergency Department (HOSPITAL_COMMUNITY)
Admission: EM | Admit: 2023-10-19 | Discharge: 2023-10-20 | Disposition: A | Discharge status: Home / Self Care | Attending: Emergency Medicine | Admitting: Emergency Medicine

## 2023-10-19 DIAGNOSIS — Z7901 Long term (current) use of anticoagulants: Secondary | ICD-10-CM | POA: Diagnosis not present

## 2023-10-19 DIAGNOSIS — M791 Myalgia, unspecified site: Secondary | ICD-10-CM | POA: Diagnosis not present

## 2023-10-19 DIAGNOSIS — R000 Tachycardia, unspecified: Secondary | ICD-10-CM | POA: Diagnosis not present

## 2023-10-19 DIAGNOSIS — M25811 Other specified joint disorders, right shoulder: Secondary | ICD-10-CM | POA: Diagnosis not present

## 2023-10-19 DIAGNOSIS — I10 Essential (primary) hypertension: Secondary | ICD-10-CM | POA: Diagnosis not present

## 2023-10-19 DIAGNOSIS — I4891 Unspecified atrial fibrillation: Secondary | ICD-10-CM | POA: Diagnosis not present

## 2023-10-19 DIAGNOSIS — M2559 Pain in other specified joint: Secondary | ICD-10-CM | POA: Insufficient documentation

## 2023-10-19 DIAGNOSIS — Z79899 Other long term (current) drug therapy: Secondary | ICD-10-CM | POA: Insufficient documentation

## 2023-10-19 DIAGNOSIS — M25511 Pain in right shoulder: Secondary | ICD-10-CM | POA: Diagnosis not present

## 2023-10-19 DIAGNOSIS — N390 Urinary tract infection, site not specified: Secondary | ICD-10-CM

## 2023-10-19 DIAGNOSIS — R531 Weakness: Secondary | ICD-10-CM | POA: Diagnosis not present

## 2023-10-19 DIAGNOSIS — M19011 Primary osteoarthritis, right shoulder: Secondary | ICD-10-CM | POA: Diagnosis not present

## 2023-10-19 DIAGNOSIS — M25561 Pain in right knee: Secondary | ICD-10-CM | POA: Diagnosis not present

## 2023-10-19 DIAGNOSIS — M25512 Pain in left shoulder: Secondary | ICD-10-CM | POA: Diagnosis not present

## 2023-10-19 LAB — MAGNESIUM: Magnesium: 2 mg/dL (ref 1.7–2.4)

## 2023-10-19 LAB — BASIC METABOLIC PANEL WITH GFR
Anion gap: 13 (ref 5–15)
BUN: 9 mg/dL (ref 8–23)
CO2: 25 mmol/L (ref 22–32)
Calcium: 9.8 mg/dL (ref 8.9–10.3)
Chloride: 106 mmol/L (ref 98–111)
Creatinine, Ser: 0.81 mg/dL (ref 0.44–1.00)
GFR, Estimated: >60 mL/min (ref >60)
Glucose, Bld: 91 mg/dL (ref 70–99)
Potassium: 4 mmol/L (ref 3.5–5.1)
Sodium: 144 mmol/L (ref 135–145)

## 2023-10-19 LAB — CBC
HCT: 40.1 % (ref 36.0–46.0)
Hemoglobin: 12.6 g/dL (ref 12.0–15.0)
MCH: 28.6 pg (ref 26.0–34.0)
MCHC: 31.4 g/dL (ref 30.0–36.0)
MCV: 91.1 fL (ref 80.0–100.0)
Platelets: 232 K/uL (ref 150–400)
RBC: 4.4 MIL/uL (ref 3.87–5.11)
RDW: 13.3 % (ref 11.5–15.5)
WBC: 5.6 K/uL (ref 4.0–10.5)
nRBC: 0 % (ref 0.0–0.2)

## 2023-10-19 NOTE — ED Triage Notes (Signed)
 PT arrives via POV. She reports pain/aching in her joints and generalized weakness for the past week. Pt arrives AxOx4.

## 2023-10-19 NOTE — ED Provider Triage Note (Signed)
 Emergency Medicine Provider Triage Evaluation Note  Monica French , a 89 y.o. female  was evaluated in triage.  Pt complains of generalized joint aching, weakness for around 1 week.  History of permanent A-fib.  She denies any chest pain, shortness of breath, fever, chills.  Review of Systems  Positive: Weakness, joint pain Negative:   Physical Exam  BP (!) 199/107   Pulse (!) 133   Temp 98 F (36.7 C)   Resp 16   SpO2 96%  Gen:   Awake, no distress   Resp:  Normal effort  MSK:   Moves extremities without difficulty  Other:  Irregularly irregular heart rhythm, rapid rate  Medical Decision Making  Medically screening exam initiated at 5:17 PM.  Appropriate orders placed.  Monica French was informed that the remainder of the evaluation will be completed by another provider, this initial triage assessment does not replace that evaluation, and the importance of remaining in the ED until their evaluation is complete.  Workup initiated in triage    Monica French, [place] 10/19/23 1719

## 2023-10-19 NOTE — ED Notes (Addendum)
 Pt is hypertensive during triage, states she took her BP meds today. PT denies headache or dizziness. No focal neurological deficits noted. She is also tachycardic, denies cp or sob

## 2023-10-20 ENCOUNTER — Emergency Department (HOSPITAL_COMMUNITY)

## 2023-10-20 DIAGNOSIS — R531 Weakness: Secondary | ICD-10-CM | POA: Diagnosis not present

## 2023-10-20 DIAGNOSIS — M25511 Pain in right shoulder: Secondary | ICD-10-CM | POA: Diagnosis not present

## 2023-10-20 DIAGNOSIS — M19011 Primary osteoarthritis, right shoulder: Secondary | ICD-10-CM | POA: Diagnosis not present

## 2023-10-20 DIAGNOSIS — M25811 Other specified joint disorders, right shoulder: Secondary | ICD-10-CM | POA: Diagnosis not present

## 2023-10-20 LAB — URINALYSIS, ROUTINE W REFLEX MICROSCOPIC
Bilirubin Urine: NEGATIVE
Glucose, UA: NEGATIVE mg/dL
Ketones, ur: 5 mg/dL — AB
Nitrite: NEGATIVE
Protein, ur: NEGATIVE mg/dL
Specific Gravity, Urine: 1.006 (ref 1.005–1.030)
WBC, UA: >50 WBC/hpf (ref 0–5)
pH: 6 (ref 5.0–8.0)

## 2023-10-20 LAB — TROPONIN I (HIGH SENSITIVITY)
Troponin I (High Sensitivity): 7 ng/L (ref <18)
Troponin I (High Sensitivity): 6 ng/L (ref <18)

## 2023-10-20 LAB — RESP PANEL BY RT-PCR (RSV, FLU A&B, COVID)  RVPGX2
Influenza A by PCR: NEGATIVE
Influenza B by PCR: NEGATIVE
Resp Syncytial Virus by PCR: NEGATIVE
SARS Coronavirus 2 by RT PCR: NEGATIVE

## 2023-10-20 LAB — CK: Total CK: 35 U/L — ABNORMAL LOW (ref 38–234)

## 2023-10-20 LAB — SEDIMENTATION RATE: Sed Rate: 26 mm/h — ABNORMAL HIGH (ref 0–22)

## 2023-10-20 LAB — C-REACTIVE PROTEIN: CRP: 1.6 mg/dL — ABNORMAL HIGH (ref <1.0)

## 2023-10-20 MED ORDER — KETOROLAC TROMETHAMINE 15 MG/ML IJ SOLN
15.0000 mg | Freq: Once | INTRAMUSCULAR | Status: AC
  Administered 2023-10-20: 15 mg via INTRAVENOUS
  Filled 2023-10-20: qty 1

## 2023-10-20 MED ORDER — SODIUM CHLORIDE 0.9 % IV BOLUS
500.0000 mL | Freq: Once | INTRAVENOUS | Status: AC
  Administered 2023-10-20: 500 mL via INTRAVENOUS

## 2023-10-20 MED ORDER — CLONIDINE HCL 0.1 MG PO TABS
0.1000 mg | ORAL_TABLET | Freq: Once | ORAL | Status: AC
  Administered 2023-10-20: 0.1 mg via ORAL
  Filled 2023-10-20: qty 1

## 2023-10-20 MED ORDER — DILTIAZEM HCL 30 MG PO TABS
30.0000 mg | ORAL_TABLET | Freq: Once | ORAL | Status: AC
  Administered 2023-10-20: 30 mg via ORAL
  Filled 2023-10-20: qty 1

## 2023-10-20 MED ORDER — CEPHALEXIN 500 MG PO CAPS: 500.0000 mg | ORAL_CAPSULE | Freq: Three times a day (TID) | ORAL | 0 refills | Status: AC

## 2023-10-20 MED ORDER — APIXABAN 5 MG PO TABS
5.0000 mg | ORAL_TABLET | Freq: Once | ORAL | Status: AC
  Administered 2023-10-20: 5 mg via ORAL
  Filled 2023-10-20: qty 1

## 2023-10-20 MED ORDER — DILTIAZEM HCL 25 MG/5ML IV SOLN
10.0000 mg | Freq: Once | INTRAVENOUS | Status: AC
  Administered 2023-10-20: 10 mg via INTRAVENOUS
  Filled 2023-10-20: qty 5

## 2023-10-20 MED ORDER — CEPHALEXIN 250 MG PO CAPS
500.0000 mg | ORAL_CAPSULE | Freq: Once | ORAL | Status: AC
  Administered 2023-10-20: 500 mg via ORAL
  Filled 2023-10-20: qty 2

## 2023-10-20 NOTE — Discharge Instructions (Addendum)
 You have a urinary tract infection which may be contributing to the sensation of aching.  Electrolytes and kidney function are normal.  Your chest XR does not show any new findings to cause your symptoms. Return to the ED with difficulty breathing, chest pain, unilateral weakness, numbness, tingling , fever, increased weakness or other concerns. Please follow up with your PCP regarding your symptoms.  It was a pleasure caring for you today !

## 2023-10-20 NOTE — ED Provider Notes (Signed)
  Physical Exam  BP 134/70   Pulse 81   Temp 97.9 F (36.6 C)   Resp (!) 22   SpO2 100%   Physical Exam  Procedures  Procedures  ED Course / MDM    Medical Decision Making Amount and/or Complexity of Data Reviewed Labs: ordered. Radiology: ordered. ECG/medicine tests: ordered.  Risk Prescription drug management.   Received care of pt from Dr. Carita at [facility].  [phone] female presents with concern for joint pain.   Labs show normal WBC, normal electrolytes, troponin WNL, COVID/flu testing negative.  CXR, UA, delta troponin pending.   Urinalysis is consistent with urinary tract infection.  Given her associated body aches which she has not had with UTIs in the past, will order blood cultures.  She is otherwise hemodynamically stable, stable for outpatient treatment.  Given a prescription for Keflex.  Recommend PCP follow-up and return precautions.        Dreama Longs, MD 10/21/23 [phone]

## 2023-10-20 NOTE — ED Provider Notes (Signed)
 [facility] Provider Note   CSN: [id number] Arrival date & time: 10/19/23  1701     Patient presents with: Joint Pain and Weakness   Monica French is a 89 y.o. female.   Patient is a history of hypertension, atrial fibrillation on Eliquis  presents with joint pain for the past 1 week.  Denies any fall or trauma.  Reports she is hurting all over and all of her joints including bilateral shoulders, knees, elbows, ankles and hips.  Everything did not start at once but she does not remember which started hurting first but things started hurting at different times.  Hypertensive and tachycardic on arrival but did not take her medication last night has been in the waiting room for over 12 hours.  She feels generally weak but no room spinning dizziness.  No chest pain or shortness of breath.  No abdominal pain, nausea, vomiting, cough or fever.  No travel or sick contacts.  No pain with urination or blood in the urine.  No missed doses of her Eliquis . Does have a history of arthritis and has had bilateral knee replacements but no other formal diagnosis of joint problems. She is trying topical remedies without relief.  She is not having any pain currently but states all of her joints are hurting and making it difficult for her to get around.  Son at bedside reports increase in forgetfulness and confusion over several months with more difficulty with memory.   The history is provided by the patient and a relative.  Weakness Associated symptoms: arthralgias and myalgias   Associated symptoms: no abdominal pain, no cough, no dizziness, no dysuria, no fever, no headaches, no nausea, no shortness of breath and no vomiting        Prior to Admission medications   Medication Sig Start Date End Date Taking? Authorizing Provider  ALPRAZolam  (XANAX ) 0.25 MG tablet Take 0.25 mg by mouth 2 (two) times daily as needed for anxiety. 06/11/20   [provider]  apixaban  (ELIQUIS ) 5 MG TABS tablet Take 1 tablet (5 mg total) by mouth 2 (two) times daily. 11/07/22   Monetta Redell PARAS, MD  Ascorbic Acid  (VITAMIN C) 1000 MG tablet Take 1,000 mg by mouth daily.    [provider]  benazepril  (LOTENSIN ) 40 MG tablet Take 40 mg by mouth every morning. 06/11/20   [provider]  cholecalciferol  (VITAMIN D3) 25 MCG (1000 UNIT) tablet Take 1,000 Units by mouth daily.    [provider]  cloNIDine  (CATAPRES ) 0.1 MG tablet Take 0.1 mg by mouth at bedtime. 06/06/23   [provider]  diltiazem  (CARDIZEM ) 30 MG tablet TAKE 1 TABLET(30 MG) BY MOUTH THREE TIMES DAILY 09/26/23   Madireddy, Alean SAUNDERS, MD  furosemide  (LASIX ) 20 MG tablet Please take furosemide  every other day as needed 09/18/22   Monetta Redell PARAS, MD  LYCOPENE PO Take 1 tablet by mouth daily. 07/13/20   [provider]  meclizine  (ANTIVERT ) 12.5 MG tablet Take 1 tablet (12.5 mg total) by mouth 3 (three) times daily as needed for dizziness. 10/13/21   Haze Lonni PARAS, MD  Multiple Vitamins-Minerals (CENTRUM SILVER 50+WOMEN) TABS Take 1 tablet by mouth daily.    [provider]  omeprazole  (PRILOSEC) 20 MG capsule TAKE 1 CAPSULE(20 MG) BY MOUTH DAILY 09/06/23   Monetta Redell PARAS, MD  ondansetron  (ZOFRAN -ODT) 4 MG disintegrating tablet 4mg  ODT q4 hours prn nausea/vomit 10/13/21   Pollina, Lonni PARAS, MD  oxyCODONE  (OXY IR/ROXICODONE ) 5 MG immediate release tablet Take 1-2 tablets (5-10 mg total) by mouth every 6 (six) hours as needed. 01/08/23   Silver Laurell Redbird, PA  potassium chloride  SA (KLOR-CON  M) 20 MEQ tablet Take 20 mEq by mouth every other day. 08/31/22   [provider]    Allergies: Codeine and Triamterene    Review of Systems  Constitutional:  Positive for fatigue. Negative for activity change, appetite change and fever.  HENT:  Negative for congestion and rhinorrhea.   Respiratory:  Negative for cough, chest tightness  and shortness of breath.   Gastrointestinal:  Negative for abdominal pain, nausea and vomiting.  Genitourinary:  Negative for dysuria and hematuria.  Musculoskeletal:  Positive for arthralgias and myalgias.  Skin:  Negative for rash.  Neurological:  Positive for weakness and light-headedness. Negative for dizziness and headaches.   all other systems are negative except as noted in the HPI and PMH.    Updated Vital Signs BP (!) 159/102 (BP Location: Right Arm)   Pulse 71   Temp 97.9 F (36.6 C)   Resp 18   SpO2 100%   Physical Exam Vitals and nursing note reviewed.  Constitutional:      General: She is not in acute distress.    Appearance: She is well-developed.  HENT:     Head: Normocephalic and atraumatic.     Mouth/Throat:     Pharynx: No oropharyngeal exudate.  Eyes:     Conjunctiva/sclera: Conjunctivae normal.     Pupils: Pupils are equal, round, and reactive to light.  Neck:     Comments: No meningismus. Cardiovascular:     Rate and Rhythm: Tachycardia present. Rhythm irregular.     Heart sounds: Normal heart sounds. No murmur heard. Pulmonary:     Effort: Pulmonary effort is normal. No respiratory distress.     Breath sounds: Normal breath sounds.  Abdominal:     Palpations: Abdomen is soft.     Tenderness: There is no abdominal tenderness. There is no guarding or rebound.  Musculoskeletal:        General: No tenderness. Normal range of motion.     Cervical back: Normal range of motion and neck supple.     Comments: Full range of motion of bilateral shoulders, elbows, wrist, hips, knees, ankles.  No obvious erythema or effusion.  Some pain with range of motion of right shoulder without bony deformity. No asymmetric swelling.  Skin:    General: Skin is warm.  Neurological:     Mental Status: She is alert and oriented to person, place, and time.     Cranial Nerves: No cranial nerve deficit.     Motor: No abnormal muscle tone.     Coordination: Coordination  normal.     Comments:  5/5 strength throughout. CN 2-12 intact.Equal grip strength.   Psychiatric:        Behavior: Behavior normal.     (all labs ordered are listed, but only abnormal results are displayed) Labs Reviewed  URINALYSIS, ROUTINE W REFLEX MICROSCOPIC - Abnormal; Notable for the following components:      Result Value   APPearance HAZY (*)    Hgb urine dipstick MODERATE (*)    Ketones, ur 5 (*)    Leukocytes,Ua LARGE (*)    Bacteria, UA MANY (*)    All other components within normal limits  CK - Abnormal; Notable for the following components:   Total CK 35 (*)    All other components within  normal limits  SEDIMENTATION RATE - Abnormal; Notable for the following components:   Sed Rate 26 (*)    All other components within normal limits  C-REACTIVE PROTEIN - Abnormal; Notable for the following components:   CRP 1.6 (*)    All other components within normal limits  RESP PANEL BY RT-PCR (RSV, FLU A&B, COVID)  [id number]  CBC  BASIC METABOLIC PANEL WITH GFR  MAGNESIUM   TROPONIN I (HIGH SENSITIVITY)  TROPONIN I (HIGH SENSITIVITY)    EKG: EKG Interpretation Date/Time:  Friday October 19 2023 17:20:52 EDT Ventricular Rate:  97 PR Interval:    QRS Duration:  80 QT Interval:  334 QTC Calculation: 424 R Axis:   88  Text Interpretation: Atrial fibrillation Possible Anterior infarct , age undetermined Abnormal ECG When compared with ECG of 18-Sep-2022 10:31, PREVIOUS ECG IS PRESENT No significant change was found Confirmed by Carita Senior [phone]) on 10/20/2023 5:12:19 AM  Radiology: [facility] Shoulder Right Result Date: 10/20/2023 CLINICAL DATA:  Joint pain. EXAM: RIGHT SHOULDER - 2+ VIEW COMPARISON:  None Available. FINDINGS: Bones are diffusely demineralized. No evidence for an acute fracture. No shoulder separation or dislocation. Degenerative changes are noted in the glenohumeral joint. 15 x 11 mm calcified intra-articular loose body identified in the subcoracoid bursa.  IMPRESSION: Degenerative changes in the glenohumeral joint with 15 x 11 mm calcified intra-articular loose body in the subcoracoid bursa. Electronically Signed   By: Camellia Candle M.D.   On: 10/20/2023 07:19   DG Chest 2 View Result Date: 10/20/2023 CLINICAL DATA:  Generalized weakness. EXAM: CHEST - 2 VIEW COMPARISON:  02/02/2021 FINDINGS: The cardio pericardial silhouette is enlarged. The lungs are clear without focal pneumonia, edema, pneumothorax or pleural effusion. No acute bony abnormality. Telemetry leads overlie the chest. IMPRESSION: Enlargement of the cardiopericardial silhouette. No acute cardiopulmonary findings. Electronically Signed   By: Camellia Candle M.D.   On: 10/20/2023 07:18     Procedures   Medications Ordered in the ED - No data to display                                  Medical Decision Making Amount and/or Complexity of Data Reviewed Labs: ordered. Decision-making details documented in ED Course. Radiology: ordered and independent interpretation performed. Decision-making details documented in ED Course. ECG/medicine tests: ordered and independent interpretation performed. Decision-making details documented in ED Course.  Risk Prescription drug management.   Pain all over without trauma.  She is hypertensive and tachycardic on arrival and this is likely secondary to her not having her medications last night she is given her home diltiazem  and Eliquis .  Denies chest pain or shortness of breath.  Labs without hypokalemia or elevated creatinine.  Will check CK.  Doubt DVT given her compliant with Eliquis .  No asymmetric leg swelling.  electrolytes are reassuring with normal creatinine and normal CK. Tachycardia has improved after giving her home Cardizem .  Troponin is negative with low concern for ACS. Full range of motion of all major joints without warmth or erythema of low concern for septic joint.  No leukocytosis.  No fever.  Nonspecific minor elevation of  her CRP 1.6.  ESR is 26.  Heart rate has improved with her home diltiazem .  Denies chest pain or shortness of breath. Unclear is what is causing her arthralgias but no evidence of septic joints at this time.  Minimal elevation with laboratory markers as above without fever  or leukocytosis. COVID and flu swabs  negative.  IVF given. Followup with PCP. May need referral to rheumatology.  Given her increased confusion forgetfulness, urinalysis and chest x-ray to be obtained. Results pending at shift change. Dr. Dreama to assume care.       Final diagnoses:  Pain in other joint    ED Discharge Orders     None          Breindy Meadow, Garnette, MD 10/20/23 [id number]

## 2023-10-21 LAB — URINE CULTURE

## 2023-10-25 DIAGNOSIS — I482 Chronic atrial fibrillation, unspecified: Secondary | ICD-10-CM | POA: Diagnosis not present

## 2023-10-25 DIAGNOSIS — Z2821 Immunization not carried out because of patient refusal: Secondary | ICD-10-CM | POA: Diagnosis not present

## 2023-10-25 DIAGNOSIS — M353 Polymyalgia rheumatica: Secondary | ICD-10-CM | POA: Diagnosis not present

## 2023-10-25 DIAGNOSIS — M1991 Primary osteoarthritis, unspecified site: Secondary | ICD-10-CM | POA: Diagnosis not present

## 2023-10-25 DIAGNOSIS — I10 Essential (primary) hypertension: Secondary | ICD-10-CM | POA: Diagnosis not present

## 2023-10-25 DIAGNOSIS — D6869 Other thrombophilia: Secondary | ICD-10-CM | POA: Diagnosis not present

## 2023-10-25 LAB — CULTURE, BLOOD (ROUTINE X 2)
Culture: NO GROWTH
Culture: NO GROWTH

## 2023-11-08 ENCOUNTER — Other Ambulatory Visit: Payer: Self-pay | Admitting: Cardiology

## 2023-11-08 DIAGNOSIS — I480 Paroxysmal atrial fibrillation: Secondary | ICD-10-CM

## 2023-11-08 NOTE — Telephone Encounter (Signed)
 Prescription refill request for Eliquis  received. Indication:afib Last office visit:6/25 Scr:0.81  10/25 Age: 89 Weight:63.9  kg  Prescription refilled
